# Patient Record
Sex: Female | Born: 1990 | Hispanic: No | Marital: Married | Smoking: Never smoker
Health system: Southern US, Community
[De-identification: ages and names within clinical notes are randomized; demographics above are authoritative.]

## PROBLEM LIST (undated history)

## (undated) ENCOUNTER — Inpatient Hospital Stay (HOSPITAL_COMMUNITY): Payer: Self-pay

## (undated) DIAGNOSIS — Z789 Other specified health status: Secondary | ICD-10-CM

---

## 2011-07-12 ENCOUNTER — Emergency Department (INDEPENDENT_AMBULATORY_CARE_PROVIDER_SITE_OTHER)
Admission: EM | Admit: 2011-07-12 | Discharge: 2011-07-12 | Disposition: A | Discharge status: Home / Self Care | Payer: Self-pay | Source: Home / Self Care | Attending: Emergency Medicine | Admitting: Emergency Medicine

## 2011-07-12 DIAGNOSIS — H1130 Conjunctival hemorrhage, unspecified eye: Secondary | ICD-10-CM

## 2011-07-12 DIAGNOSIS — T148XXA Other injury of unspecified body region, initial encounter: Secondary | ICD-10-CM

## 2011-07-12 MED ORDER — IBUPROFEN 800 MG PO TABS: 800.0000 mg | ORAL_TABLET | Freq: Three times a day (TID) | ORAL | Status: AC

## 2011-07-12 NOTE — ED Provider Notes (Signed)
Medical screening examination/treatment/procedure(s) were performed by non-physician practitioner and as supervising physician I was immediately available for consultation/collaboration.  Raynald Blend, MD 07/12/11 2120
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
## 2012-03-26 ENCOUNTER — Inpatient Hospital Stay (HOSPITAL_COMMUNITY): Payer: Medicaid Other

## 2012-03-26 ENCOUNTER — Encounter (HOSPITAL_COMMUNITY): Payer: Self-pay | Admitting: *Deleted

## 2012-03-26 ENCOUNTER — Inpatient Hospital Stay (HOSPITAL_COMMUNITY)
Admission: AD | Admit: 2012-03-26 | Discharge: 2012-03-27 | Disposition: A | Discharge status: Home / Self Care | Payer: Medicaid Other | Source: Ambulatory Visit | Attending: Obstetrics & Gynecology | Admitting: Obstetrics & Gynecology

## 2012-03-26 DIAGNOSIS — R1032 Left lower quadrant pain: Secondary | ICD-10-CM | POA: Insufficient documentation

## 2012-03-26 DIAGNOSIS — O209 Hemorrhage in early pregnancy, unspecified: Secondary | ICD-10-CM | POA: Insufficient documentation

## 2012-03-26 LAB — URINALYSIS, ROUTINE W REFLEX MICROSCOPIC
Bilirubin Urine: NEGATIVE
Glucose, UA: NEGATIVE mg/dL
Ketones, ur: NEGATIVE mg/dL
Specific Gravity, Urine: 1.01 (ref 1.005–1.030)
pH: 7 (ref 5.0–8.0)

## 2012-03-26 LAB — URINE MICROSCOPIC-ADD ON

## 2012-03-26 MED ORDER — ACETAMINOPHEN 500 MG PO TABS
1000.0000 mg | ORAL_TABLET | Freq: Once | ORAL | Status: AC
  Administered 2012-03-26: 1000 mg via ORAL
  Filled 2012-03-26: qty 2

## 2012-03-26 NOTE — MAU Note (Signed)
Pt G2 P1 at 8.2wks bleeding since 1400 today and cramping.

## 2012-03-27 DIAGNOSIS — O209 Hemorrhage in early pregnancy, unspecified: Secondary | ICD-10-CM

## 2012-03-27 LAB — HCG, QUANTITATIVE, PREGNANCY: hCG, Beta Chain, Quant, S: 9825 m[IU]/mL — ABNORMAL HIGH (ref <5)
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
## 2012-03-27 NOTE — ED Provider Notes (Signed)
I saw and examined patient and agree with above. By LMP pt is 8 wks 2 days. However, sono shows no yolk sack or fetal pole. No ectopic visualized. Quant hcg done today. Pt instructed to return to MAU in 2 days for f/u quant and exam. May still Truszkowski too early to rule out ectopic. Pt [facility] with [facility] for pregnancy care. Recommended tylenol for pain.    Sono:  IMPRESSION:  A gestational sac is seen within the uterus with mean sac diameter  corresponding to a gestational age of [redacted] weeks 2 days.  The gestational sac is slightly irregular and no definite fetal  pole or yolk sac is identified.  Follow-up sonography recommended in 10 days to establish fetal  viability.   Napoleon Form, MD 03/27/2012 2:09 AM

## 2012-03-28 ENCOUNTER — Encounter (HOSPITAL_COMMUNITY): Payer: Self-pay | Admitting: *Deleted

## 2012-03-28 ENCOUNTER — Other Ambulatory Visit (HOSPITAL_COMMUNITY): Payer: Medicaid Other

## 2012-03-28 ENCOUNTER — Inpatient Hospital Stay (HOSPITAL_COMMUNITY): Payer: Medicaid Other

## 2012-03-28 ENCOUNTER — Inpatient Hospital Stay (HOSPITAL_COMMUNITY)
Admission: AD | Admit: 2012-03-28 | Discharge: 2012-03-29 | Disposition: A | Discharge status: Home / Self Care | Payer: Medicaid Other | Source: Ambulatory Visit | Attending: Obstetrics & Gynecology | Admitting: Obstetrics & Gynecology

## 2012-03-28 DIAGNOSIS — O200 Threatened abortion: Secondary | ICD-10-CM | POA: Insufficient documentation

## 2012-03-28 DIAGNOSIS — O039 Complete or unspecified spontaneous abortion without complication: Secondary | ICD-10-CM

## 2012-03-28 LAB — CBC
Hemoglobin: 13.3 g/dL (ref 12.0–15.0)
MCH: 29.3 pg (ref 26.0–34.0)
MCHC: 34.8 g/dL (ref 30.0–36.0)
Platelets: 202 10*3/uL (ref 150–400)
RDW: 12.2 % (ref 11.5–15.5)

## 2012-03-28 LAB — ABO/RH: ABO/RH(D): B POS

## 2012-03-28 LAB — HCG, QUANTITATIVE, PREGNANCY: hCG, Beta Chain, Quant, S: 6812 m[IU]/mL — ABNORMAL HIGH (ref <5)

## 2012-03-28 NOTE — MAU Note (Signed)
Pt reports increased pain 2 hours, pt seen in MAU on Tuesday for bleeding with pregnancy, bleeding is worse today

## 2012-03-28 NOTE — MAU Provider Note (Signed)
History     CSN: [id number]  Arrival date and time: 03/28/12 2104   First Provider Initiated Contact with Patient 03/28/12 2214      Chief Complaint  Patient presents with  . Vaginal Bleeding   HPI This is a 21 y.o. female at 6 wks of gestation who presents with complaint of heavier bleeding. She was seen almost 2 days ago for bleeding and found to have a uterus with gestational sac only and no fetal pole or yolk sac.  GS was 2 weeks behind dates.   Now has heavier bleeding and cramping.   OB History    Grav Para Term Preterm Abortions TAB SAB Ect Mult Living   3 1 1       1       History reviewed. No pertinent past medical history.  Past Surgical History  Procedure Date  . Cesarean section   . No past surgeries     History reviewed. No pertinent family history.  History  Substance Use Topics  . Smoking status: Never Smoker   . Smokeless tobacco: Not on file  . Alcohol Use: No    Allergies: No Known Allergies  No prescriptions prior to admission    ROS As in HPI  Physical Exam   Blood pressure 113/80, pulse 98, temperature 99.1 F (37.3 C), temperature source Oral, resp. rate 18, height 4\' 11"  (1.499 m), weight 107 lb (48.535 kg), last menstrual period 01/28/2012, SpO2 100.00%.  Physical Exam  Constitutional: She is oriented to person, place, and time. She appears well-developed and well-nourished. No distress.  Cardiovascular: Normal rate.   Respiratory: Effort normal.  GI: Soft. She exhibits no distension and no mass. There is tenderness (lower pelvic). There is no rebound and no guarding.  Genitourinary: Uterus normal. Vaginal discharge (moderately heavy blood in vault with clots) found.       Cervix FT/long  Musculoskeletal: Normal range of motion.  Neurological: She is alert and oriented to person, place, and time.  Skin: Skin is warm and dry.  Psychiatric: She has a normal mood and affect.   Results for orders placed during the hospital encounter  of 03/28/12 (from the past 24 hour(s))  HCG, QUANTITATIVE, PREGNANCY     Status: Abnormal   Collection Time   03/28/12  9:25 PM      Component Value Range   hCG, Beta Chain, Quant, S 6812 (*) <5 mIU/mL  ABO/RH     Status: Normal   Collection Time   03/28/12  9:25 PM      Component Value Range   ABO/RH(D) B POS    CBC     Status: Normal   Collection Time   03/28/12  9:25 PM      Component Value Range   WBC 10.4  4.0 - 10.5 K/uL   RBC 4.54  3.87 - 5.11 MIL/uL   Hemoglobin 13.3  12.0 - 15.0 g/dL   HCT 40.9  81.1 - 91.4 %   MCV 84.1  78.0 - 100.0 fL   MCH 29.3  26.0 - 34.0 pg   MCHC 34.8  30.0 - 36.0 g/dL   RDW 78.2  95.6 - 21.3 %   Platelets 202  150 - 400 K/uL   Last Quant was 9000+ (less than 2 days ago) US Ob Transvaginal  03/29/2012  *RADIOLOGY REPORT*  Clinical Data: First trimester pregnancy with bleeding and cramping.  Beta HCG level 6812 (9825 on 03/27/2012).  TRANSVAGINAL OB ULTRASOUND  Technique:  Transvaginal  ultrasound was performed for evaluation of the gestation as well as the maternal uterus and adnexal regions.  Comparison: Prior examination 03/26/2012 2342 hours  Findings: A misshapen intrauterine gestational sac has a mean sac diameter of 9.1 mm (previously 14.2 mm).  Sonographer noted this to collapse and re-expand on real time imaging. There is no evidence of a fetal pole or yolk sac.  The endometrial complex is thickened and heterogeneous.  The maternal ovaries are visualized appear normal.  There is no adnexal mass or free pelvic fluid.  IMPRESSION: Findings are most consistent with an abortion in progress.   Original Report Authenticated By: Gerrianne Scale, M.D.     MAU Course  Procedures  Assessment and Plan  A:  Pregnancy at 6-8 weeks      Inevitable SAB with collapsed gestational sac in uterus      Stable hemoglobin  P:  Discussed results with her and her family        Brother interpreted per her request       Will proceed expectantly       Repeat Quant  Monday or Tuesday, expect a considerable drop       Rx Percocet for pain       Bleeding precautions.  Come back sooner if heavy bleeding  [facility] 03/28/2012, 10:46 PM

## 2012-03-29 DIAGNOSIS — O039 Complete or unspecified spontaneous abortion without complication: Secondary | ICD-10-CM

## 2012-03-29 MED ORDER — KETOROLAC TROMETHAMINE 30 MG/ML IJ SOLN
30.0000 mg | Freq: Once | INTRAMUSCULAR | Status: AC
  Administered 2012-03-29: 30 mg via INTRAMUSCULAR
  Filled 2012-03-29: qty 1

## 2012-03-29 MED ORDER — OXYCODONE-ACETAMINOPHEN 5-325 MG PO TABS: 1.0000 | ORAL_TABLET | ORAL | Status: AC | PRN

## 2012-03-29 MED ORDER — IBUPROFEN 600 MG PO TABS: 600.0000 mg | ORAL_TABLET | Freq: Four times a day (QID) | ORAL | Status: AC | PRN

## 2012-03-29 MED ORDER — OXYCODONE-ACETAMINOPHEN 5-325 MG PO TABS
1.0000 | ORAL_TABLET | Freq: Once | ORAL | Status: AC
  Administered 2012-03-29: 1 via ORAL
  Filled 2012-03-29: qty 1

## 2012-03-29 NOTE — Progress Notes (Signed)
Brother a the bedside with the pt . Brother is able to repeat information given about medication and returning to MAU if bleeding increases

## 2012-04-02 ENCOUNTER — Inpatient Hospital Stay (HOSPITAL_COMMUNITY)
Admission: AD | Admit: 2012-04-02 | Discharge: 2012-04-02 | Disposition: A | Discharge status: Home / Self Care | Payer: Medicaid Other | Source: Ambulatory Visit | Attending: Obstetrics & Gynecology | Admitting: Obstetrics & Gynecology

## 2012-04-02 ENCOUNTER — Encounter (HOSPITAL_COMMUNITY): Payer: Self-pay | Admitting: *Deleted

## 2012-04-02 DIAGNOSIS — O99891 Other specified diseases and conditions complicating pregnancy: Secondary | ICD-10-CM | POA: Insufficient documentation

## 2012-04-02 LAB — HCG, QUANTITATIVE, PREGNANCY: hCG, Beta Chain, Quant, S: 202 m[IU]/mL — ABNORMAL HIGH (ref <5)

## 2012-04-02 NOTE — MAU Note (Signed)
Pt in for follow up blood work

## 2012-04-07 NOTE — MAU Provider Note (Signed)
  History     CSN: [id number]  Arrival date and time: 03/26/12 2021   First Provider Initiated Contact with Patient 03/26/12 2241      Chief Complaint  Patient presents with  . Vaginal Bleeding   HPI  Pt is here on 9/10 for f/u HCG. She was seen on 03/27/12 with light bleeding with diffuse LLQ pain at [redacted]w[redacted]d pregnancy- ON [place] she had irregular sac measuring [redacted]w[redacted]d, no yolk sac HCG 9825.  On 03/28/2012 pt had increase in pain and bleeding.  Her HCG was 6812 and ultrasound showed ab in progress.  This date on 9/10, pt's HCG 202, pt had minimal complaints, minimal pain and bleeding.    History reviewed. No pertinent past medical history.  Past Surgical History  Procedure Date  . Cesarean section   . No past surgeries     History reviewed. No pertinent family history.  History  Substance Use Topics  . Smoking status: Never Smoker   . Smokeless tobacco: Not on file  . Alcohol Use: No    Allergies: No Known Allergies  No prescriptions prior to admission    ROS Physical Exam   Blood pressure 120/80, pulse 81, temperature 97.5 F (36.4 C), temperature source Oral, resp. rate 16, height 4\' 11"  (1.499 m), weight 49.805 kg (109 lb 12.8 oz), last menstrual period 01/28/2012.  Physical Exam  Nursing note and vitals reviewed. Constitutional: She is oriented to person, place, and time. She appears well-developed and well-nourished.  HENT:  Head: Normocephalic.  Eyes: Pupils are equal, round, and reactive to light.  Neck: Normal range of motion. Neck supple.  Cardiovascular: Normal rate.   Respiratory: Effort normal.  GI: Soft.  Musculoskeletal: Normal range of motion.  Neurological: She is alert and oriented to person, place, and time.  Skin: Skin is warm and dry.  Psychiatric: She has a normal mood and affect.    MAU Course  Procedures HCG 202 SAB- f/u in GYN clinic in one week for repeat HCG  Assessment and Plan  SAB F/u on 04/09/2012 in GYN clinic for repeat HCG- clinic to  call pt  Clair Bardwell 04/07/2012, 8:53 PM

## 2012-04-09 ENCOUNTER — Other Ambulatory Visit: Payer: Medicaid Other

## 2012-04-09 DIAGNOSIS — O039 Complete or unspecified spontaneous abortion without complication: Secondary | ICD-10-CM

## 2012-04-16 ENCOUNTER — Emergency Department (HOSPITAL_COMMUNITY)
Admission: EM | Admit: 2012-04-16 | Discharge: 2012-04-16 | Disposition: A | Discharge status: Home / Self Care | Payer: Medicaid Other | Source: Home / Self Care | Attending: Family Medicine | Admitting: Family Medicine

## 2012-04-16 ENCOUNTER — Encounter (HOSPITAL_COMMUNITY): Payer: Self-pay | Admitting: *Deleted

## 2012-04-16 DIAGNOSIS — H6120 Impacted cerumen, unspecified ear: Secondary | ICD-10-CM

## 2012-04-16 DIAGNOSIS — H61899 Other specified disorders of external ear, unspecified ear: Secondary | ICD-10-CM

## 2012-04-16 NOTE — ED Notes (Signed)
Pt reports bug in her left ear since last night

## 2012-04-16 NOTE — ED Notes (Signed)
Ear irrigation procedure delayed d/t this Clinical research associate (performer of procedure) being occupied w/ another pt's procedures & [facility] transfer.

## 2012-04-16 NOTE — ED Provider Notes (Signed)
History     CSN: [id number]  Arrival date & time 04/16/12  1106   First MD Initiated Contact with Patient 04/16/12 1301      Chief Complaint  Patient presents with  . Foreign Body in Ear    (Consider location/radiation/quality/duration/timing/severity/associated sxs/prior treatment) Patient is a 21 y.o. female presenting with foreign body in ear. The history is provided by the patient.  Foreign Body in Ear This is a new problem.  Patient complains of decreased hearing to left ear.  States ear feels like it is "plugged up" or as if something flew in her ear since last night.  Attempts to clear with warm water irrigation with no relief.  No history of chronic ear infections, hearing loss or ear tubes. Denies fever, upper respiratory symptoms and headache.  No drainage from ear and no neck pain.  History reviewed. No pertinent past medical history.  Past Surgical History  Procedure Date  . Cesarean section   . No past surgeries     Family History  Problem Relation Age of Onset  . Family history unknown: Yes    History  Substance Use Topics  . Smoking status: Never Smoker   . Smokeless tobacco: Not on file  . Alcohol Use: No    OB History    Grav Para Term Preterm Abortions TAB SAB Ect Mult Living   3 1 1       1       Review of Systems  Constitutional: Negative.   HENT: Positive for hearing loss and ear pain. Negative for congestion, sore throat, rhinorrhea, sneezing, postnasal drip, sinus pressure, tinnitus and ear discharge.   Respiratory: Negative.   Cardiovascular: Negative.     Allergies  Review of patient's allergies indicates no known allergies.  Home Medications  No current outpatient prescriptions on file.  BP 110/70  Pulse 78  Temp 97.5 F (36.4 C)  Resp 16  SpO2 98%  LMP 01/28/2012  Physical Exam  Nursing note and vitals reviewed. Constitutional: She is oriented to person, place, and time. Vital signs are normal. She appears well-developed  and well-nourished. She is active and cooperative.  HENT:  Head: Normocephalic.  Right Ear: Hearing, tympanic membrane, external ear and ear canal normal.  Left Ear: No drainage, swelling or tenderness. No foreign bodies. No mastoid tenderness. Tympanic membrane is not injected.  No middle ear effusion. Decreased hearing is noted.  Nose: Nose normal.  Mouth/Throat: Uvula is midline, oropharynx is clear and moist and mucous membranes are normal.       Left ear cerumen impaction.  Eyes: Conjunctivae normal are normal. Pupils are equal, round, and reactive to light. No scleral icterus.  Neck: Trachea normal. Neck supple.  Cardiovascular: Normal rate, regular rhythm, normal heart sounds, intact distal pulses and normal pulses.   Pulmonary/Chest: Effort normal and breath sounds normal.  Lymphadenopathy:       Head (right side): No submental, no submandibular, no tonsillar, no preauricular, no posterior auricular and no occipital adenopathy present.       Head (left side): No submental, no submandibular, no tonsillar, no preauricular, no posterior auricular and no occipital adenopathy present.    She has no cervical adenopathy.  Neurological: She is alert and oriented to person, place, and time. No cranial nerve deficit or sensory deficit.  Skin: Skin is warm and dry.  Psychiatric: She has a normal mood and affect. Her speech is normal and behavior is normal. Judgment and thought content normal. Cognition and memory  are normal.    ED Course  Procedures (including critical care time)  Labs Reviewed - No data to display No results found.   1. Foreign body sensation in ear canal   2. Hearing loss secondary to cerumen impaction       MDM          Johnsie Kindred, NP 04/16/12 1415

## 2012-04-18 NOTE — ED Provider Notes (Signed)
Medical screening examination/treatment/procedure(s) were performed by resident physician or non-physician practitioner and as supervising physician I was immediately available for consultation/collaboration.   KINDL,JAMES DOUGLAS MD.    James D Kindl, MD 04/18/12 2048 

## 2012-05-01 ENCOUNTER — Inpatient Hospital Stay (HOSPITAL_COMMUNITY)
Admission: AD | Admit: 2012-05-01 | Discharge: 2012-05-01 | Disposition: A | Discharge status: Home / Self Care | Payer: Medicaid Other | Source: Ambulatory Visit | Attending: Obstetrics & Gynecology | Admitting: Obstetrics & Gynecology

## 2012-05-01 ENCOUNTER — Encounter (HOSPITAL_COMMUNITY): Payer: Self-pay | Admitting: *Deleted

## 2012-05-01 DIAGNOSIS — R141 Gas pain: Secondary | ICD-10-CM

## 2012-05-01 DIAGNOSIS — R143 Flatulence: Secondary | ICD-10-CM | POA: Insufficient documentation

## 2012-05-01 DIAGNOSIS — R142 Eructation: Secondary | ICD-10-CM | POA: Insufficient documentation

## 2012-05-01 DIAGNOSIS — R140 Abdominal distension (gaseous): Secondary | ICD-10-CM

## 2012-05-01 HISTORY — DX: Other specified health status: Z78.9

## 2012-05-01 NOTE — MAU Provider Note (Signed)
  History     CSN: [id number]  Arrival date and time: 05/01/12 1746   First Provider Initiated Contact with Patient 05/01/12 1842      Chief Complaint  Patient presents with  . Uncertain of miscarriage    HPI This is a 21 y.o. female who is s/p Spontaneous Abortion on 03/29/12.  Her quantitative HCG levels were followed after that until they reached about 16.  Results for Teresa, [name] (MRN [id number]) as of 05/01/2012 18:30  Ref. Range 03/27/2012 00:45 03/28/2012 21:25 04/02/2012 13:46 04/09/2012 11:22  hCG, Beta Chain, Quant, S No range found 9825 (H) 6812 (H) 202 (H) 16.8   She had heavy bleeding and [place] confirmed SAB in progress.  Pt states she feels a "shaking" sensation in her upper abdomen and feels her abdomen is growing in size.  States it was never this big. Thinks she may still Teresa [name] pregnant and the baby is moving.   Denies diarrhea or constipation. Denies bleeding. Denies dysuria or frequency.  OB History    Grav Para Term Preterm Abortions TAB SAB Ect Mult Living   2 1 1  1  1   1       Past Medical History  Diagnosis Date  . No pertinent past medical history     Past Surgical History  Procedure Date  . Cesarean section     Family History  Problem Relation Age of Onset  . Other Neg Hx     History  Substance Use Topics  . Smoking status: Never Smoker   . Smokeless tobacco: Never Used  . Alcohol Use: No    Allergies: No Known Allergies  No prescriptions prior to admission    ROS See HPI  Physical Exam   Blood pressure 110/75, pulse 85, temperature 97.9 F (36.6 C), temperature source Oral, resp. rate 16, height 4\' 11"  (1.499 m), weight 107 lb 8 oz (48.762 kg), last menstrual period 01/28/2012, unknown if currently breastfeeding.  Physical Exam  Constitutional: She is oriented to person, place, and time. She appears well-developed and well-nourished. No distress.  Cardiovascular: Normal rate.   Respiratory: Effort normal.  GI: Soft. She exhibits distension  (very mild distention with small layer of subcutaneous fat palpable.). She exhibits no mass. There is no tenderness. There is no rebound and no guarding.  Musculoskeletal: Normal range of motion.  Neurological: She is alert and oriented to person, place, and time.  Skin: Skin is warm and dry.  Psychiatric: She has a normal mood and affect.   Declines pelvic exam  UPT Negative  MAU Course  Procedures  Assessment and Plan  A:  Abdominal bloating      Probable IBS      Not pregnant      Pseudocyesis  P:  Discussed lack of pregnancy       Discussed this is probably gas bloating, but pt wants eval of her digestive system      Agrees to referral to [facility]      Referral made  [facility] 05/01/2012, 6:53 PM

## 2012-05-01 NOTE — MAU Note (Signed)
Pt/family state pt was told she had miscarriage. Pt still feels pregnant, however took home upt that was negative. Pt states she never had a lot of bleeding. Denies pain at present.

## 2012-05-01 NOTE — MAU Note (Signed)
No period since miscarriage.bleeding stopped by mid Sept.

## 2012-05-01 NOTE — MAU Note (Signed)
Concerned about stomach becoming bigger. Notified preg test is negative.

## 2012-07-24 NOTE — L&D Delivery Note (Signed)
Delivery Note At 6:24 PM a viable and healthy female was delivered via VBAC, Spontaneous (Presentation: Left Occiput Anterior).  APGAR: 8, 9; weight pending.   Placenta status: Intact, Spontaneous.  Cord: 3 vessels with the following complications: None.   Anesthesia: Epidural  Episiotomy: None Lacerations: 1st degree perineal Suture Repair: 3.0 vicryl Est. Blood Loss (mL): 350  Mom to postpartum.  Baby to nursery-stable.  Tawni Carnes 02/19/2013, 6:53 PM

## 2012-10-30 ENCOUNTER — Other Ambulatory Visit (HOSPITAL_COMMUNITY): Payer: Self-pay | Admitting: Physician Assistant

## 2012-10-30 DIAGNOSIS — Z3689 Encounter for other specified antenatal screening: Secondary | ICD-10-CM

## 2012-10-30 LAB — OB RESULTS CONSOLE HEPATITIS B SURFACE ANTIGEN: Hepatitis B Surface Ag: NEGATIVE

## 2012-10-30 LAB — OB RESULTS CONSOLE GC/CHLAMYDIA
Chlamydia: NEGATIVE
Gonorrhea: NEGATIVE

## 2012-11-04 ENCOUNTER — Encounter (HOSPITAL_COMMUNITY): Payer: Self-pay

## 2012-11-04 ENCOUNTER — Ambulatory Visit (HOSPITAL_COMMUNITY)
Admission: RE | Admit: 2012-11-04 | Discharge: 2012-11-04 | Disposition: A | Discharge status: Home / Self Care | Payer: Medicaid Other | Source: Ambulatory Visit | Attending: Physician Assistant | Admitting: Physician Assistant

## 2012-11-04 DIAGNOSIS — Z3689 Encounter for other specified antenatal screening: Secondary | ICD-10-CM

## 2012-11-04 DIAGNOSIS — O358XX0 Maternal care for other (suspected) fetal abnormality and damage, not applicable or unspecified: Secondary | ICD-10-CM | POA: Insufficient documentation

## 2012-11-04 DIAGNOSIS — Z1389 Encounter for screening for other disorder: Secondary | ICD-10-CM | POA: Insufficient documentation

## 2012-11-04 DIAGNOSIS — Z363 Encounter for antenatal screening for malformations: Secondary | ICD-10-CM | POA: Insufficient documentation

## 2013-01-30 LAB — OB RESULTS CONSOLE GBS: GBS: NEGATIVE

## 2013-02-19 ENCOUNTER — Inpatient Hospital Stay (HOSPITAL_COMMUNITY)
Admission: AD | Admit: 2013-02-19 | Discharge: 2013-02-21 | DRG: 775 | Disposition: A | Discharge status: Home / Self Care | Payer: Medicaid Other | Source: Ambulatory Visit | Attending: Obstetrics and Gynecology | Admitting: Obstetrics and Gynecology

## 2013-02-19 ENCOUNTER — Encounter (HOSPITAL_COMMUNITY): Payer: Self-pay | Admitting: *Deleted

## 2013-02-19 ENCOUNTER — Inpatient Hospital Stay (HOSPITAL_COMMUNITY): Payer: Medicaid Other | Admitting: Anesthesiology

## 2013-02-19 ENCOUNTER — Encounter (HOSPITAL_COMMUNITY): Payer: Self-pay | Admitting: Anesthesiology

## 2013-02-19 DIAGNOSIS — O34219 Maternal care for unspecified type scar from previous cesarean delivery: Secondary | ICD-10-CM

## 2013-02-19 HISTORY — DX: Other specified health status: Z78.9

## 2013-02-19 LAB — RPR: RPR Ser Ql: NONREACTIVE

## 2013-02-19 LAB — CBC
Hemoglobin: 11.2 g/dL — ABNORMAL LOW (ref 12.0–15.0)
MCH: 28.5 pg (ref 26.0–34.0)
Platelets: 175 10*3/uL (ref 150–400)
RBC: 3.93 MIL/uL (ref 3.87–5.11)
WBC: 9.5 10*3/uL (ref 4.0–10.5)

## 2013-02-19 MED ORDER — LIDOCAINE HCL (PF) 1 % IJ SOLN
30.0000 mL | INTRAMUSCULAR | Status: DC | PRN
  Administered 2013-02-19: 30 mL via SUBCUTANEOUS
  Filled 2013-02-19 (×2): qty 30

## 2013-02-19 MED ORDER — IBUPROFEN 600 MG PO TABS
600.0000 mg | ORAL_TABLET | Freq: Four times a day (QID) | ORAL | Status: DC
  Administered 2013-02-19 – 2013-02-21 (×6): 600 mg via ORAL
  Filled 2013-02-19 (×6): qty 1

## 2013-02-19 MED ORDER — DIBUCAINE 1 % RE OINT: 1.0000 "application " | TOPICAL_OINTMENT | RECTAL | Status: DC | PRN

## 2013-02-19 MED ORDER — FENTANYL CITRATE 0.05 MG/ML IJ SOLN: 100.0000 ug | INTRAMUSCULAR | Status: DC | PRN

## 2013-02-19 MED ORDER — OXYTOCIN 40 UNITS IN LACTATED RINGERS INFUSION - SIMPLE MED
62.5000 mL/h | INTRAVENOUS | Status: DC
  Administered 2013-02-19: 999 mL/h via INTRAVENOUS
  Filled 2013-02-19: qty 1000

## 2013-02-19 MED ORDER — WITCH HAZEL-GLYCERIN EX PADS: 1.0000 "application " | MEDICATED_PAD | CUTANEOUS | Status: DC | PRN

## 2013-02-19 MED ORDER — LIDOCAINE HCL (PF) 1 % IJ SOLN
INTRAMUSCULAR | Status: DC | PRN
  Administered 2013-02-19: 3 mL
  Administered 2013-02-19: 4 mL

## 2013-02-19 MED ORDER — PHENYLEPHRINE 40 MCG/ML (10ML) SYRINGE FOR IV PUSH (FOR BLOOD PRESSURE SUPPORT)
80.0000 ug | PREFILLED_SYRINGE | INTRAVENOUS | Status: DC | PRN
  Filled 2013-02-19: qty 2

## 2013-02-19 MED ORDER — ACETAMINOPHEN 325 MG PO TABS: 650.0000 mg | ORAL_TABLET | ORAL | Status: DC | PRN

## 2013-02-19 MED ORDER — EPHEDRINE 5 MG/ML INJ
10.0000 mg | INTRAVENOUS | Status: DC | PRN
  Filled 2013-02-19: qty 2

## 2013-02-19 MED ORDER — ONDANSETRON HCL 4 MG PO TABS: 4.0000 mg | ORAL_TABLET | ORAL | Status: DC | PRN

## 2013-02-19 MED ORDER — OXYCODONE-ACETAMINOPHEN 5-325 MG PO TABS
1.0000 | ORAL_TABLET | ORAL | Status: DC | PRN
  Administered 2013-02-20: 1 via ORAL
  Filled 2013-02-19: qty 1

## 2013-02-19 MED ORDER — FENTANYL 2.5 MCG/ML BUPIVACAINE 1/10 % EPIDURAL INFUSION (WH - ANES)
14.0000 mL/h | INTRAMUSCULAR | Status: DC | PRN
  Filled 2013-02-19: qty 125

## 2013-02-19 MED ORDER — DIPHENHYDRAMINE HCL 50 MG/ML IJ SOLN: 12.5000 mg | INTRAMUSCULAR | Status: DC | PRN

## 2013-02-19 MED ORDER — LACTATED RINGERS IV SOLN: 500.0000 mL | Freq: Once | INTRAVENOUS | Status: DC

## 2013-02-19 MED ORDER — PRENATAL MULTIVITAMIN CH
1.0000 | ORAL_TABLET | Freq: Every day | ORAL | Status: DC
  Administered 2013-02-20: 1 via ORAL
  Filled 2013-02-19: qty 1

## 2013-02-19 MED ORDER — BENZOCAINE-MENTHOL 20-0.5 % EX AERO
1.0000 "application " | INHALATION_SPRAY | CUTANEOUS | Status: DC | PRN
  Administered 2013-02-19: 1 via TOPICAL
  Filled 2013-02-19: qty 56

## 2013-02-19 MED ORDER — ZOLPIDEM TARTRATE 5 MG PO TABS: 5.0000 mg | ORAL_TABLET | Freq: Every evening | ORAL | Status: DC | PRN

## 2013-02-19 MED ORDER — OXYTOCIN BOLUS FROM INFUSION: 500.0000 mL | INTRAVENOUS | Status: DC

## 2013-02-19 MED ORDER — LACTATED RINGERS IV SOLN: 500.0000 mL | INTRAVENOUS | Status: DC | PRN

## 2013-02-19 MED ORDER — CITRIC ACID-SODIUM CITRATE 334-500 MG/5ML PO SOLN: 30.0000 mL | ORAL | Status: DC | PRN

## 2013-02-19 MED ORDER — FENTANYL 2.5 MCG/ML BUPIVACAINE 1/10 % EPIDURAL INFUSION (WH - ANES)
INTRAMUSCULAR | Status: DC | PRN
  Administered 2013-02-19: 12 mL/h via EPIDURAL

## 2013-02-19 MED ORDER — LACTATED RINGERS IV SOLN
INTRAVENOUS | Status: DC
  Administered 2013-02-19 (×3): via INTRAVENOUS

## 2013-02-19 MED ORDER — FLEET ENEMA 7-19 GM/118ML RE ENEM: 1.0000 | ENEMA | RECTAL | Status: DC | PRN

## 2013-02-19 MED ORDER — ONDANSETRON HCL 4 MG/2ML IJ SOLN: 4.0000 mg | INTRAMUSCULAR | Status: DC | PRN

## 2013-02-19 MED ORDER — OXYCODONE-ACETAMINOPHEN 5-325 MG PO TABS: 1.0000 | ORAL_TABLET | ORAL | Status: DC | PRN

## 2013-02-19 MED ORDER — PHENYLEPHRINE 40 MCG/ML (10ML) SYRINGE FOR IV PUSH (FOR BLOOD PRESSURE SUPPORT)
80.0000 ug | PREFILLED_SYRINGE | INTRAVENOUS | Status: DC | PRN
  Filled 2013-02-19: qty 2
  Filled 2013-02-19: qty 5

## 2013-02-19 MED ORDER — ONDANSETRON HCL 4 MG/2ML IJ SOLN
4.0000 mg | Freq: Four times a day (QID) | INTRAMUSCULAR | Status: DC | PRN
  Administered 2013-02-19: 4 mg via INTRAVENOUS
  Filled 2013-02-19: qty 2

## 2013-02-19 MED ORDER — IBUPROFEN 600 MG PO TABS: 600.0000 mg | ORAL_TABLET | Freq: Four times a day (QID) | ORAL | Status: DC | PRN

## 2013-02-19 MED ORDER — DIPHENHYDRAMINE HCL 25 MG PO CAPS: 25.0000 mg | ORAL_CAPSULE | Freq: Four times a day (QID) | ORAL | Status: DC | PRN

## 2013-02-19 MED ORDER — SIMETHICONE 80 MG PO CHEW: 80.0000 mg | CHEWABLE_TABLET | ORAL | Status: DC | PRN

## 2013-02-19 MED ORDER — LANOLIN HYDROUS EX OINT: TOPICAL_OINTMENT | CUTANEOUS | Status: DC | PRN

## 2013-02-19 MED ORDER — TETANUS-DIPHTH-ACELL PERTUSSIS 5-2.5-18.5 LF-MCG/0.5 IM SUSP: 0.5000 mL | Freq: Once | INTRAMUSCULAR | Status: DC

## 2013-02-19 MED ORDER — SENNOSIDES-DOCUSATE SODIUM 8.6-50 MG PO TABS
2.0000 | ORAL_TABLET | Freq: Every day | ORAL | Status: DC
  Administered 2013-02-20: 2 via ORAL

## 2013-02-19 MED ORDER — EPHEDRINE 5 MG/ML INJ
10.0000 mg | INTRAVENOUS | Status: DC | PRN
  Filled 2013-02-19: qty 4
  Filled 2013-02-19: qty 2
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
## 2013-02-19 NOTE — MAU Note (Signed)
Pt speaks limnited Eng-has an intrep with her-states she has been feeling contractions since 0200

## 2013-02-19 NOTE — Anesthesia Preprocedure Evaluation (Signed)
Anesthesia Evaluation  Patient identified by MRN, date of birth, ID band Patient awake    Reviewed: Allergy & Precautions, H&P , Patient's Chart, lab work & pertinent test results  Airway Mallampati: II TM Distance: >3 FB Neck ROM: full    Dental no notable dental hx. (+) Teeth Intact   Pulmonary neg pulmonary ROS,  breath sounds clear to auscultation  Pulmonary exam normal       Cardiovascular negative cardio ROS  Rhythm:regular Rate:Normal     Neuro/Psych negative neurological ROS  negative psych ROS   GI/Hepatic negative GI ROS, Neg liver ROS,   Endo/Other  negative endocrine ROS  Renal/GU negative Renal ROS  negative genitourinary   Musculoskeletal   Abdominal Normal abdominal exam  (+)   Peds  Hematology negative hematology ROS (+)   Anesthesia Other Findings Speaks Burmese  Reproductive/Obstetrics (+) Pregnancy                           Anesthesia Physical Anesthesia Plan  ASA: II  Anesthesia Plan: Epidural   Post-op Pain Management:    Induction:   Airway Management Planned:   Additional Equipment:   Intra-op Plan:   Post-operative Plan:   Informed Consent: I have reviewed the patients History and Physical, chart, labs and discussed the procedure including the risks, benefits and alternatives for the proposed anesthesia with the patient or authorized representative who has indicated his/her understanding and acceptance.     Plan Discussed with: Anesthesiologist  Anesthesia Plan Comments:         Anesthesia Quick Evaluation

## 2013-02-19 NOTE — Progress Notes (Signed)
Camia Tome is a 22 y.o. G3P1011 at [redacted]w[redacted]d by ultrasound admitted for active labor  Subjective:good pain relief with epidural   Objective: BP 110/70  Pulse 99  Temp(Src) 97.7 F (36.5 C) (Axillary)  Resp 20  Ht 5' (1.524 m)  Wt 134 lb (60.782 kg)  BMI 26.17 kg/m2  SpO2 99%  LMP 01/28/2012      FHT:  FHR: 135 bpm, variability: moderate,  accelerations:  Present,  decelerations:  Absent UC:   regular, every 4  minutes SVE:   Dilation: 5.5 Effacement (%): 90 Station: -2 Exam by:: Dr. Debroah Loop  Labs: Lab Results  Component Value Date   WBC 9.5 02/19/2013   HGB 11.2* 02/19/2013   HCT 33.3* 02/19/2013   MCV 84.7 02/19/2013   PLT 175 02/19/2013    Assessment / Plan: Spontaneous labor, progressing normally  Labor: Progressing normally Preeclampsia:  no signs or symptoms of toxicity Fetal Wellbeing:  Category I Pain Control:  Epidural  Anticipated MOD:  NSVD  ARNOLD,JAMES 02/19/2013, 1:38 PM

## 2013-02-19 NOTE — Anesthesia Procedure Notes (Signed)
Epidural Patient location during procedure: OB Start time: 02/19/2013 9:03 AM  Staffing Anesthesiologist: Jakeline Dave A. Performed by: anesthesiologist   Preanesthetic Checklist Completed: patient identified, site marked, surgical consent, pre-op evaluation, timeout performed, IV checked, risks and benefits discussed and monitors and equipment checked  Epidural Patient position: sitting Prep: site prepped and draped and DuraPrep Patient monitoring: continuous pulse ox and blood pressure Approach: midline Injection technique: LOR air  Needle:  Needle type: Tuohy  Needle gauge: 17 G Needle length: 9 cm and 9 Needle insertion depth: 4 cm Catheter type: closed end flexible Catheter size: 19 Gauge Catheter at skin depth: 9 cm Test dose: negative and Other  Assessment Events: blood not aspirated, injection not painful, no injection resistance, negative IV test and no paresthesia  Additional Notes Patient identified. Risks and benefits discussed including failed block, incomplete  Pain control, post dural puncture headache, nerve damage, paralysis, blood pressure Changes, nausea, vomiting, reactions to medications-both toxic and allergic and post Partum back pain. All questions were answered. Patient expressed understanding and wished to proceed. Sterile technique was used throughout procedure. Epidural site was Dressed with sterile barrier dressing. No paresthesias, signs of intravascular injection Or signs of intrathecal spread were encountered. Burmese interpreter used for procedure. Patient was more comfortable after the epidural was dosed. Please see RN's note for documentation of vital signs and FHR which are stable.

## 2013-02-20 LAB — GC/CHLAMYDIA PROBE AMP
CT Probe RNA: NEGATIVE
GC Probe RNA: NEGATIVE

## 2013-02-20 NOTE — Anesthesia Postprocedure Evaluation (Signed)
Anesthesia Post Note  Patient: Teresa [name]  Procedure(s) Performed: * No procedures listed *  Anesthesia type: Epidural  Patient location: Mother/Baby  Post pain: Pain level controlled  Post assessment: Post-op Vital signs reviewed  Last Vitals:  Filed Vitals:   02/20/13 0545  BP: 96/63  Pulse: 86  Temp: 36.8 C  Resp: 16    Post vital signs: Reviewed  Level of consciousness: awake  Complications: No apparent anesthesia complications Anesthesia Post-op Note  Patient: Teresa [name]  Procedure(s) Performed: * No procedures listed *  Patient Location: PACU and Mother/Baby  Anesthesia Type:Epidural  Level of Consciousness: awake, alert , oriented and patient cooperative  Airway and Oxygen Therapy: Patient Spontanous Breathing  Post-op Pain: none  Post-op Assessment: Post-op Vital signs reviewed, Adequate PO intake, No headache, No backache, No residual numbness and No residual motor weakness  Post-op Vital Signs: Reviewed and stable  Complications: No apparent anesthesia complications

## 2013-02-20 NOTE — Progress Notes (Signed)
Post Partum Day 1 Subjective: no complaints, up ad lib, voiding and tolerating PO. No BM, pain 3/10 and well controlled.  Undecided on OCP.  Objective: Blood pressure 96/63, pulse 86, temperature 98.2 F (36.8 C), temperature source Oral, resp. rate 16, height 5' (1.524 m), weight 134 lb (60.782 kg), last menstrual period 01/28/2012, SpO2 99.00%, currently bottle feeding.  Physical Exam:  General: alert, cooperative and no distress Lochia: appropriate Uterine Fundus: firm Incision: no incision DVT Evaluation: No evidence of DVT seen on physical exam.   Recent Labs  02/19/13 0610  HGB 11.2*  HCT 33.3*    Assessment/Plan: Plan for discharge tomorrow Desires nexplanon Bottle feeding Pain controlled on motrin Meeting milestones   LOS: 1 day   Dorrie, Cocuzza 02/20/2013, 7:56 AM

## 2013-02-20 NOTE — Progress Notes (Signed)
I spoke with and examined patient and agree with PA-S's note and plan of care.  Gitel Beste Ryan Courteny Egler, MD Ob Fellow 02/20/2013 9:08 AM   

## 2013-02-20 NOTE — Progress Notes (Signed)
UR chart review completed.  

## 2013-02-21 MED ORDER — IBUPROFEN 200 MG PO TABS: 600.0000 mg | ORAL_TABLET | Freq: Four times a day (QID) | ORAL | Status: DC | PRN

## 2013-02-21 MED ORDER — PRENATAL MULTIVITAMIN CH: 1.0000 | ORAL_TABLET | Freq: Every day | ORAL | Status: DC

## 2013-02-21 NOTE — Discharge Summary (Signed)
Obstetric Discharge Summary Reason for Admission: onset of labor and TOLAC Prenatal Procedures: none Intrapartum Procedures: none Postpartum Procedures: none Complications-Operative and Postpartum: none Hemoglobin  Date Value Range Status  02/19/2013 11.2* 12.0 - 15.0 g/dL Final     HCT  Date Value Range Status  02/19/2013 33.3* 36.0 - 46.0 % Final    Physical Exam:  General: alert, cooperative, appears stated age and no distress Lochia: appropriate Uterine Fundus: firm U-1 Incision: no incision DVT Evaluation: No evidence of DVT seen on physical exam. Negative Homan's sign. No cords or calf tenderness. No significant calf/ankle edema.  Discharge Diagnoses: Vaginal Birth after C section  Discharge Information: Date: 02/21/2013 Activity: unrestricted, pelvic rest and no sex for 6 weeks Diet: routine Medications: PNV and Ibuprofen Condition: stable Instructions: refer to practice specific booklet Discharge to: home Follow-up Information   Follow up with [facility]. Schedule an appointment as soon as possible for a visit in 4 weeks. (please make appt for 4-6 wks)    Contact information:   [address] [place] [address] [phone]      Newborn Data: Live born female  Birth Weight: 7 lb 4.4 oz (3300 g) APGAR: 8, 9  Home with mother.  Tawana Scale 02/21/2013, 9:37 AM

## 2013-02-24 NOTE — H&P (Signed)
Attestation of Attending Supervision of Advanced Practitioner (CNM/NP): Evaluation and management procedures were performed by the Advanced Practitioner under my supervision and collaboration.  I have reviewed the Advanced Practitioner's note and chart, and I agree with the management and plan.  Keylin Ferryman 02/24/2013 2:22 PM   

## 2013-07-22 ENCOUNTER — Emergency Department (HOSPITAL_COMMUNITY)
Admission: EM | Admit: 2013-07-22 | Discharge: 2013-07-22 | Disposition: A | Discharge status: Home / Self Care | Payer: Medicaid Other | Attending: Emergency Medicine | Admitting: Emergency Medicine

## 2013-07-22 ENCOUNTER — Encounter (HOSPITAL_COMMUNITY): Payer: Self-pay | Admitting: Emergency Medicine

## 2013-07-22 DIAGNOSIS — J019 Acute sinusitis, unspecified: Secondary | ICD-10-CM | POA: Insufficient documentation

## 2013-07-22 DIAGNOSIS — H9209 Otalgia, unspecified ear: Secondary | ICD-10-CM | POA: Insufficient documentation

## 2013-07-22 LAB — RAPID STREP SCREEN (MED CTR MEBANE ONLY): Streptococcus, Group A Screen (Direct): NEGATIVE

## 2013-07-22 MED ORDER — AMOXICILLIN-POT CLAVULANATE 875-125 MG PO TABS: 1.0000 | ORAL_TABLET | Freq: Two times a day (BID) | ORAL | Status: DC

## 2013-07-22 NOTE — ED Provider Notes (Signed)
CSN: [id number]     Arrival date & time 07/22/13  1301 History   This chart was scribed for Raymon Mutton PA-C working with Flint Melter, MD by Ardelia Mems, ED Scribe. This patient was seen in room TR11C/TR11C and the patient's care was started at 4:27 PM   Chief Complaint  Patient presents with  . Otalgia  . Sore Throat    The history is provided by the patient. A language interpreter was used (Burmese).   HPI Comments: Teresa [name] is a 22 y.o. female who presents to the Emergency Department complaining a constant, gradually worsening sore throat over the past 2-3 weeks. She states that this pain is worsened with swallowing. She reports associated constant, moderate left ear pain onset 3 days ago. She describes her ear pain as a foreign body sensation, and states that she hears a "buzzing" sound in her left ear. She states that she has been using Q-tips. She also reports an associated sinus pressure, facial pain, nasal congestion, and occasional chills. She states that she has not tried any medications for her symptoms. She denies fever, cough, chest pain, SOB or difficulty breathing, neck pain or stiffness, nausea, emesis or any other symptoms.   Past Medical History  Diagnosis Date  . No pertinent past medical history   . Medical history non-contributory    Past Surgical History  Procedure Laterality Date  . Cesarean section     Family History  Problem Relation Age of Onset  . Other Neg Hx    History  Substance Use Topics  . Smoking status: Never Smoker   . Smokeless tobacco: Never Used  . Alcohol Use: No   OB History   Grav Para Term Preterm Abortions TAB SAB Ect Mult Living   3 2 2  1  1   2      Review of Systems  Constitutional: Positive for chills. Negative for fever.  HENT: Positive for congestion, ear pain (left), sinus pressure and sore throat.        Facial pain  Respiratory: Negative for cough and shortness of breath.   Cardiovascular: Negative for chest  pain.  Gastrointestinal: Negative for nausea and vomiting.  Musculoskeletal: Negative for neck pain and neck stiffness.    Allergies  Review of patient's allergies indicates no known allergies.  Home Medications   Current Outpatient Rx  Name  Route  Sig  Dispense  Refill  . amoxicillin-clavulanate (AUGMENTIN) 875-125 MG per tablet   Oral   Take 1 tablet by mouth 2 (two) times daily. One po bid x 7 days   14 tablet   0     Triage Vitals: BP 112/65  Pulse 88  Temp(Src) 97.7 F (36.5 C) (Oral)  Resp 18  Wt 104 lb 3 oz (47.259 kg)  SpO2 100%  Physical Exam  Nursing note and vitals reviewed. Constitutional: She is oriented to person, place, and time. She appears well-developed and well-nourished. No distress.  HENT:  Head: Normocephalic and atraumatic.  Right Ear: External ear normal.  Left Ear: External ear normal.  Mouth/Throat: Oropharynx is clear and moist. No oropharyngeal exudate.  Negative facial swelling. Discomfort upon palpation to the frontal and bilateral maxillary sinuses. Negative swelling, erythema, inflammation, lesions, sores, exudate, petechia noted to the posterior oropharynx at bilateral tonsils. Negative enlargement of the tonsils bilaterally. Uvula midline, symmetrical elevation. Negative uvula swelling identified.  Eyes: Conjunctivae and EOM are normal. Pupils are equal, round, and reactive to light. Right eye exhibits no discharge.  Left eye exhibits no discharge.  Neck: Normal range of motion. Neck supple. No tracheal deviation present.  Negative neck stiffness Negative nuchal rigidity Negative cervical lymphadenopathy Negative meningeal signs  Cardiovascular: Normal rate, regular rhythm and normal heart sounds.  Exam reveals no friction rub.   No murmur heard. Pulses:      Radial pulses are 2+ on the right side, and 2+ on the left side.  Cap refill less than 3 seconds  Pulmonary/Chest: Effort normal and breath sounds normal. No respiratory  distress. She has no wheezes. She has no rales. She exhibits no tenderness.  Airway intact Patient able to speak in full sentences without difficulty Negative use of accessory muscles  Musculoskeletal: Normal range of motion. She exhibits no tenderness.  Full ROM to upper and lower extremities without difficulty noted, negative ataxia noted   Lymphadenopathy:    She has no cervical adenopathy.  Neurological: She is alert and oriented to person, place, and time. She exhibits normal muscle tone. Coordination normal.  Skin: Skin is warm and dry. No rash noted. She is not diaphoretic. No erythema.  Psychiatric: She has a normal mood and affect. Her behavior is normal. Thought content normal.    ED Course  Procedures   DIAGNOSTIC STUDIES: Oxygen Saturation is 100% on RA, normal by my interpretation.    COORDINATION OF CARE: 4:32 PM- A Rapid Strep screen has bee obtained. Pt advised of plan for treatment and pt agrees.  Results for orders placed during the hospital encounter of 07/22/13  RAPID STREP SCREEN      Result Value Range   Streptococcus, Group A Screen (Direct) NEGATIVE  NEGATIVE    Labs Review Labs Reviewed  RAPID STREP SCREEN  CULTURE, GROUP A STREP   Imaging Review No results found.  EKG Interpretation   None       MDM   1. Acute sinusitis     Filed Vitals:   07/22/13 1309 07/22/13 1714  BP: 112/65 97/75  Pulse: 88 79  Temp: 97.7 F (36.5 C) 97.1 F (36.2 C)  TempSrc: Oral Oral  Resp: 18 18  Weight: 104 lb 3 oz (47.259 kg)   SpO2: 100% 100%   I personally performed the services described in this documentation, which was scribed in my presence. The recorded information has been reviewed and is accurate.  Patient presenting to emergency department with sore throat that has been ongoing for the past 2-3 weeks. Patient reports she's been experiencing left ear pain past 2-3 days. Alert and oriented. GCS seen. Heart rate and rhythm normal. Pulses:  Strong, radial 2+ bilaterally lungs clear to auscultation bilaterally. Negative neck stiffness, negative nuchal rigidity, negative cervical lymphadenopathy. Negative meningeal signs. Discomfort upon palpation to the frontal and maxillary sinuses. Ear exam unremarkable. Negative respiratory distress, patient is able to speak in full senses without difficulty. Rapid strep negative. Doubt streptococcal pharyngitis. Suspicion to Hoppel acute sinus infection. Patient stable, afebrile. Discharge patient with antibiotics. Referred patient to urgent care Center. Discussed with patient to rest and stay hydrated. Recommended Tylenol as needed. Discussed with patient to closely monitor symptoms and if symptoms are to worsen or change to report back to the ED - strict return instructions given.  Patient agreed to plan of care, understood, all questions answered.    Raymon Mutton, PA-C 07/23/13 1527

## 2013-07-22 NOTE — ED Notes (Signed)
Pt in c/o left earache and sore throat x2 days, denies fever

## 2013-07-22 NOTE — ED Notes (Addendum)
Pts brother interpreting for pt.  Pt speaks burmese.

## 2013-07-23 NOTE — ED Provider Notes (Signed)
Medical screening examination/treatment/procedure(s) were performed by non-physician practitioner and as supervising physician I was immediately available for consultation/collaboration.  Flint Melter, MD 07/23/13 [phone]

## 2013-07-24 LAB — CULTURE, GROUP A STREP

## 2014-03-17 ENCOUNTER — Emergency Department (HOSPITAL_COMMUNITY)
Admission: EM | Admit: 2014-03-17 | Discharge: 2014-03-17 | Disposition: A | Discharge status: Home / Self Care | Payer: Self-pay | Attending: Emergency Medicine | Admitting: Emergency Medicine

## 2014-03-17 ENCOUNTER — Emergency Department (HOSPITAL_COMMUNITY): Payer: Medicaid Other

## 2014-03-17 ENCOUNTER — Encounter (HOSPITAL_COMMUNITY): Payer: Self-pay | Admitting: Emergency Medicine

## 2014-03-17 ENCOUNTER — Emergency Department (HOSPITAL_COMMUNITY): Payer: Self-pay

## 2014-03-17 DIAGNOSIS — Z3202 Encounter for pregnancy test, result negative: Secondary | ICD-10-CM | POA: Insufficient documentation

## 2014-03-17 DIAGNOSIS — Z9889 Other specified postprocedural states: Secondary | ICD-10-CM | POA: Insufficient documentation

## 2014-03-17 DIAGNOSIS — R1030 Lower abdominal pain, unspecified: Secondary | ICD-10-CM

## 2014-03-17 DIAGNOSIS — R109 Unspecified abdominal pain: Secondary | ICD-10-CM

## 2014-03-17 DIAGNOSIS — R112 Nausea with vomiting, unspecified: Secondary | ICD-10-CM

## 2014-03-17 DIAGNOSIS — R1033 Periumbilical pain: Secondary | ICD-10-CM | POA: Insufficient documentation

## 2014-03-17 DIAGNOSIS — N898 Other specified noninflammatory disorders of vagina: Secondary | ICD-10-CM | POA: Insufficient documentation

## 2014-03-17 LAB — CBC WITH DIFFERENTIAL/PLATELET
Basophils Absolute: 0 10*3/uL (ref 0.0–0.1)
Basophils Relative: 0 % (ref 0–1)
EOS ABS: 0.2 10*3/uL (ref 0.0–0.7)
Eosinophils Relative: 4 % (ref 0–5)
HCT: 42.6 % (ref 36.0–46.0)
Hemoglobin: 13.7 g/dL (ref 12.0–15.0)
LYMPHS ABS: 1.7 10*3/uL (ref 0.7–4.0)
Lymphocytes Relative: 35 % (ref 12–46)
MCH: 25.9 pg — AB (ref 26.0–34.0)
MCHC: 32.2 g/dL (ref 30.0–36.0)
MCV: 80.7 fL (ref 78.0–100.0)
Monocytes Absolute: 0.2 10*3/uL (ref 0.1–1.0)
Monocytes Relative: 5 % (ref 3–12)
NEUTROS PCT: 56 % (ref 43–77)
Neutro Abs: 2.6 10*3/uL (ref 1.7–7.7)
PLATELETS: 225 10*3/uL (ref 150–400)
RBC: 5.28 MIL/uL — AB (ref 3.87–5.11)
RDW: 13.8 % (ref 11.5–15.5)
WBC: 4.7 10*3/uL (ref 4.0–10.5)

## 2014-03-17 LAB — COMPREHENSIVE METABOLIC PANEL
ALBUMIN: 4.2 g/dL (ref 3.5–5.2)
ALK PHOS: 54 U/L (ref 39–117)
ALT: 11 U/L (ref 0–35)
AST: 17 U/L (ref 0–37)
Anion gap: 12 (ref 5–15)
BUN: 9 mg/dL (ref 6–23)
CO2: 23 mEq/L (ref 19–32)
Calcium: 9 mg/dL (ref 8.4–10.5)
Chloride: 104 mEq/L (ref 96–112)
Creatinine, Ser: 0.58 mg/dL (ref 0.50–1.10)
GFR calc non Af Amer: >90 mL/min (ref >90)
GLUCOSE: 86 mg/dL (ref 70–99)
POTASSIUM: 3.9 meq/L (ref 3.7–5.3)
SODIUM: 139 meq/L (ref 137–147)
TOTAL PROTEIN: 7.9 g/dL (ref 6.0–8.3)
Total Bilirubin: 0.5 mg/dL (ref 0.3–1.2)

## 2014-03-17 LAB — URINALYSIS, ROUTINE W REFLEX MICROSCOPIC
BILIRUBIN URINE: NEGATIVE
Glucose, UA: NEGATIVE mg/dL
KETONES UR: 15 mg/dL — AB
NITRITE: NEGATIVE
PROTEIN: NEGATIVE mg/dL
Specific Gravity, Urine: 1.014 (ref 1.005–1.030)
UROBILINOGEN UA: 0.2 mg/dL (ref 0.0–1.0)
pH: 6 (ref 5.0–8.0)

## 2014-03-17 LAB — URINE MICROSCOPIC-ADD ON

## 2014-03-17 LAB — WET PREP, GENITAL
CLUE CELLS WET PREP: NONE SEEN
TRICH WET PREP: NONE SEEN
YEAST WET PREP: NONE SEEN

## 2014-03-17 LAB — PREGNANCY, URINE: Preg Test, Ur: NEGATIVE

## 2014-03-17 LAB — LIPASE, BLOOD: Lipase: 43 U/L (ref 11–59)

## 2014-03-17 MED ORDER — ONDANSETRON 4 MG PO TBDP
4.0000 mg | ORAL_TABLET | Freq: Once | ORAL | Status: AC
  Administered 2014-03-17: 4 mg via ORAL
  Filled 2014-03-17: qty 1

## 2014-03-17 MED ORDER — ONDANSETRON HCL 8 MG PO TABS: 8.0000 mg | ORAL_TABLET | Freq: Three times a day (TID) | ORAL | Status: DC | PRN

## 2014-03-17 MED ORDER — ONDANSETRON HCL 4 MG/2ML IJ SOLN
4.0000 mg | Freq: Once | INTRAMUSCULAR | Status: AC
  Administered 2014-03-17: 4 mg via INTRAVENOUS
  Filled 2014-03-17: qty 2

## 2014-03-17 MED ORDER — SODIUM CHLORIDE 0.9 % IV BOLUS (SEPSIS)
1000.0000 mL | Freq: Once | INTRAVENOUS | Status: AC
  Administered 2014-03-17: 1000 mL via INTRAVENOUS

## 2014-03-17 MED ORDER — HYDROCODONE-ACETAMINOPHEN 5-325 MG PO TABS: 1.0000 | ORAL_TABLET | Freq: Four times a day (QID) | ORAL | Status: DC | PRN

## 2014-03-17 MED ORDER — IOHEXOL 300 MG/ML  SOLN
25.0000 mL | INTRAMUSCULAR | Status: AC
  Administered 2014-03-17 (×2): 25 mL via ORAL

## 2014-03-17 MED ORDER — MORPHINE SULFATE 4 MG/ML IJ SOLN
4.0000 mg | Freq: Once | INTRAMUSCULAR | Status: AC
  Administered 2014-03-17: 4 mg via INTRAVENOUS
  Filled 2014-03-17: qty 1

## 2014-03-17 MED ORDER — IOHEXOL 300 MG/ML  SOLN
80.0000 mL | Freq: Once | INTRAMUSCULAR | Status: AC | PRN
  Administered 2014-03-17: 80 mL via INTRAVENOUS

## 2014-03-17 MED ORDER — CEFTRIAXONE SODIUM 1 G IJ SOLR
1.0000 g | Freq: Once | INTRAMUSCULAR | Status: AC
Start: 2014-03-17 — End: 2014-03-17
  Administered 2014-03-17: 1 g via INTRAMUSCULAR
  Filled 2014-03-17: qty 10

## 2014-03-17 MED ORDER — AZITHROMYCIN 250 MG PO TABS
1000.0000 mg | ORAL_TABLET | Freq: Once | ORAL | Status: AC
  Administered 2014-03-17: 1000 mg via ORAL
  Filled 2014-03-17: qty 4

## 2014-03-17 NOTE — Consult Note (Signed)
[facility] Consult Note  Audrina Gullett 14-Dec-1990  [id number].    Requesting MD: Dr. Stark Jock Chief Complaint/Reason for Consult: abdominal pain ?appendicitis on CT scan  HPI:  History/physical obtained with the use of a burmese interpreter on pacific interpreters line.  23 y/o healthy without significant PMH G2P2A0 Burmese (minimal [place]) female presents to [facility] with 1-2 weeks of abdominal pain, N/V.  She does not know what brought it on and has never had this pain before.  She says the pain has gradually worsened over the last few days.  She denies any vomiting today, no fevers/chills, no CP/SOB, no trouble urinating/deficating.  She does note that she had an implenon placed in her am and since then she has had bleeding nearly every day since placed 1 year ago.  She has a 23 y/o and 23 y/o children at home.  She had a c-section with her first pregnancy.    CT was concerned for a slightly prominent appendix.  She has multiple b/l ovarian cysts concerning for polycystic ovaries.  She has a prominent mesenteric lymph node.  CT recommended pelvic US.  WBC is normal as well as rest of labs.  She is mildly tachycardic at 104, normotensive, normal temp.    ROS: All systems reviewed and otherwise negative except for as above  Family History  Problem Relation Age of Onset  . Other Neg Hx     Past Medical History  Diagnosis Date  . No pertinent past medical history   . Medical history non-contributory     Past Surgical History  Procedure Laterality Date  . Cesarean section      Social History:  reports that she has never smoked. She has never used smokeless tobacco. She reports that she does not drink alcohol or use illicit drugs.  Allergies: No Known Allergies   (Not in a hospital admission)  Blood pressure 99/68, pulse 104, temperature 97.6 F (36.4 C), temperature source Oral, resp. rate 17, height 5' (1.524 m), weight 110 lb (49.896 kg), SpO2 100.00%, unknown if currently  breastfeeding. Physical Exam: General: pleasant, WD/WN Asian female who is laying in bed in mild distress due to pain HEENT: head is normocephalic, atraumatic.  Sclera are noninjected.  PERRL.  Ears and nose without any masses or lesions.  Mouth is pink and moist Heart: regular, rate, and rhythm.  No obvious murmurs, gallops, or rubs noted.  Palpable pedal pulses bilaterally Lungs: CTAB, no wheezes, rhonchi, or rales noted.  Respiratory effort nonlabored Abd: soft, tender in the periumbilical and left mid abdomen, no tenderness at Mcburney's point, no peritoneal signs, no rebound/guarding, +BS, no masses, hernias, or organomegaly, low horizontal scar well healed from c-section MS: all 4 extremities are symmetrical with no cyanosis, clubbing, or edema. Skin: mild diaphoresis, warm with no masses, lesions, or rashes Psych: A&Ox3 with an appropriate affect.   Results for orders placed during the hospital encounter of 03/17/14 (from the past 48 hour(s))  CBC WITH DIFFERENTIAL     Status: Abnormal   Collection Time    03/17/14 10:53 AM      Result Value Ref Range   WBC 4.7  4.0 - 10.5 K/uL   RBC 5.28 (*) 3.87 - 5.11 MIL/uL   Hemoglobin 13.7  12.0 - 15.0 g/dL   HCT 42.6  36.0 - 46.0 %   MCV 80.7  78.0 - 100.0 fL   MCH 25.9 (*) 26.0 - 34.0 pg   MCHC 32.2  30.0 - 36.0 g/dL  RDW 13.8  11.5 - 15.5 %   Platelets 225  150 - 400 K/uL   Neutrophils Relative % 56  43 - 77 %   Neutro Abs 2.6  1.7 - 7.7 K/uL   Lymphocytes Relative 35  12 - 46 %   Lymphs Abs 1.7  0.7 - 4.0 K/uL   Monocytes Relative 5  3 - 12 %   Monocytes Absolute 0.2  0.1 - 1.0 K/uL   Eosinophils Relative 4  0 - 5 %   Eosinophils Absolute 0.2  0.0 - 0.7 K/uL   Basophils Relative 0  0 - 1 %   Basophils Absolute 0.0  0.0 - 0.1 K/uL  COMPREHENSIVE METABOLIC PANEL     Status: None   Collection Time    03/17/14 10:53 AM      Result Value Ref Range   Sodium 139  137 - 147 mEq/L   Potassium 3.9  3.7 - 5.3 mEq/L   Chloride 104  96  - 112 mEq/L   CO2 23  19 - 32 mEq/L   Glucose, Bld 86  70 - 99 mg/dL   BUN 9  6 - 23 mg/dL   Creatinine, Ser 0.58  0.50 - 1.10 mg/dL   Calcium 9.0  8.4 - 10.5 mg/dL   Total Protein 7.9  6.0 - 8.3 g/dL   Albumin 4.2  3.5 - 5.2 g/dL   AST 17  0 - 37 U/L   ALT 11  0 - 35 U/L   Alkaline Phosphatase 54  39 - 117 U/L   Total Bilirubin 0.5  0.3 - 1.2 mg/dL   GFR calc non Af Amer >90  >90 mL/min   GFR calc Af Amer >90  >90 mL/min   Comment: (NOTE)     The eGFR has been calculated using the CKD EPI equation.     This calculation has not been validated in all clinical situations.     eGFR's persistently <90 mL/min signify possible Chronic Kidney     Disease.   Anion gap 12  5 - 15  LIPASE, BLOOD     Status: None   Collection Time    03/17/14 10:53 AM      Result Value Ref Range   Lipase 43  11 - 59 U/L  URINALYSIS, ROUTINE W REFLEX MICROSCOPIC     Status: Abnormal   Collection Time    03/17/14 11:04 AM      Result Value Ref Range   Color, Urine AMBER (*) YELLOW   Comment: BIOCHEMICALS MAY Ocain AFFECTED BY COLOR   APPearance CLOUDY (*) CLEAR   Specific Gravity, Urine 1.014  1.005 - 1.030   pH 6.0  5.0 - 8.0   Glucose, UA NEGATIVE  NEGATIVE mg/dL   Hgb urine dipstick LARGE (*) NEGATIVE   Bilirubin Urine NEGATIVE  NEGATIVE   Ketones, ur 15 (*) NEGATIVE mg/dL   Protein, ur NEGATIVE  NEGATIVE mg/dL   Urobilinogen, UA 0.2  0.0 - 1.0 mg/dL   Nitrite NEGATIVE  NEGATIVE   Leukocytes, UA TRACE (*) NEGATIVE  PREGNANCY, URINE     Status: None   Collection Time    03/17/14 11:04 AM      Result Value Ref Range   Preg Test, Ur NEGATIVE  NEGATIVE   Comment:            THE SENSITIVITY OF THIS     METHODOLOGY IS >20 mIU/mL.  URINE MICROSCOPIC-ADD ON     Status: Abnormal  Collection Time    03/17/14 11:04 AM      Result Value Ref Range   Squamous Epithelial / LPF MANY (*) RARE   WBC, UA 7-10  <3 WBC/hpf   RBC / HPF 21-50  <3 RBC/hpf   Bacteria, UA FEW (*) RARE   Ct Abdomen Pelvis W  Contrast  03/17/2014   CLINICAL DATA:  Pain.  EXAM: CT ABDOMEN AND PELVIS WITH CONTRAST  TECHNIQUE: Multidetector CT imaging of the abdomen and pelvis was performed using the standard protocol following bolus administration of intravenous contrast.  CONTRAST:  89m OMNIPAQUE IOHEXOL 300 MG/ML  SOLN  COMPARISON:  None.  FINDINGS: Liver normal. Spleen normal. Pancreas normal. No biliary distention. Gallbladder is not distended.  Adrenals normal. No focal renal abnormality. No hydronephrosis or obstructing ureteral stone. Bladder is nondistended. Bilateral ovarian enlargement noted. Multiple tiny ovarian cysts appear to Zunker present. Polycystic ovary disease could present in this fashion. Tiny low-density noted in the left body of the uterus is most like versus tiny fibroid. Further evaluation of these findings can [place] performed with pelvic ultrasound. Also correlate with previously ordered pregnancy test. No free pelvic fluid.  No significant adenopathy. Aorta normal caliber. Visceral vessels are patent. Portal vein is patent.  Minimal prominence of the appendix to 9 mm noted. Appendicitis cannot Ashline completely excluded. Tiny amount of oral contrast versus appendicolith noted in the orifice of the appendix. Stool noted throughout the colon. No evidence of bowel obstruction. No free air. Multiple mesenteric lymph nodes are noted. Mesenteric adenitis cannot Golightly excluded. No significant hernia.  Lung bases are clear. Heart size normal. No acute bony abnormality.  IMPRESSION: 1. Very mild prominence of the appendix noted. Subtle changes of appendicitis cannot Lias excluded. Associated small mesenteric lymph nodes suggesting mesenteric adenitis noted. 2. Bilateral enlarged ovaries with probable multiple tiny cysts. This suggest polycystic ovarian disease. Subtle low-density noted in the left uterine cornua. This may represent a fibroid. Pelvic ultrasound is suggested for further evaluation of these findings.   Electronically  Signed   By: TMarcello Moores Register   On: 03/17/2014 15:14      Assessment/Plan Mild prominence of appendix Mesenteric LAD Abdominal pain (periumbilical and mid left abdomen) Nausea/vomiting Multiple ovarian cysts Metrorrhagia - Breakthrough bleeding on contraceptive rod H/o c-section, G2P2A0  Plan: 1.  Need GYN workup - pelvic exam, pelvic [place] ?GYN consult.  Does not appear to have pain in RLQ at all.  We reviewed the CT and it is not overwhelming for appendicitis.  WBC normal.  History does not go alone with appendicitis.  She has multiple b/l ovarian cysts which may Chanda the cause of her pain.   2.  Does not have a surgical abdomen, no need for appendectomy at this time.  If the pain worsens she may need to return to the ER for re-evaluation. 3.  Rest of workup per EDP   DCoralie Keens PBever[facility] LLCSurgery 03/17/2014, 4:09 PM Pager: [phone]

## 2014-03-17 NOTE — ED Notes (Signed)
Gave pt prescription for zofran. Pt wishes to rest here a little longer. Will continue to monitor.

## 2014-03-17 NOTE — ED Notes (Signed)
Patient states has been having abdominal pain throughout for 2 weeks.  Patient denies N/V.   Patient denies urinary problems.

## 2014-03-17 NOTE — ED Provider Notes (Signed)
18:15- I. was asked by ED nursing to evaluate the patient, prior to discharging her. The nurse is concerned that she has vomited and still feels ill.  The patient was seen earlier, and discharged by Dr.Delo. She was diagnosed with lower abdominal pain. She has had a comprehensive evaluation and treatment. She has received IV fluids, IV antibiotics, IV antiemetics, and IV analgesia, with morphine. She was also treated with oral Zithromax, 1 g. The emesis occurred after that.  Exam- alert, calm, cooperative. She complains of "dizziness". Heart regular rate and rhythm without murmur. Lungs clear to auscultation. Abdomen normal bowel sounds, soft, mild left mid abdominal tenderness. No rebound tenderness.  Additional Zofran, 4 mg given. The patient stated that she wanted to try going home with oral antibiotic, and oral analgesia. A family member is with her and agrees to this plan.      Flint Melter, MD 03/18/14 [phone]

## 2014-03-17 NOTE — ED Notes (Signed)
Pt no longer vomiting. Pt ready for D/C. MD aware. Will wheel pt out to car

## 2014-03-17 NOTE — ED Notes (Signed)
Pt vomited just prior to d/c. Notified PA. Will give PO zofran and monitor.

## 2014-03-17 NOTE — Discharge Instructions (Signed)
Hydrocodone as prescribed as needed for pain.  Followup with your GYN to discuss removal of your Implanon.  Return to the emergency department if you develop severe pain, high fever, or any other new or concerning symptoms.   Abdominal Pain Many things can cause abdominal pain. Usually, abdominal pain is not caused by a disease and will improve without treatment. It can often Roach observed and treated at home. Your health care provider will do a physical exam and possibly order blood tests and X-rays to help determine the seriousness of your pain. However, in many cases, more time must pass before a clear cause of the pain can Beckett found. Before that point, your health care provider may not know if you need more testing or further treatment. HOME CARE INSTRUCTIONS  Monitor your abdominal pain for any changes. The following actions may help to alleviate any discomfort you are experiencing:  Only take over-the-counter or prescription medicines as directed by your health care provider.  Do not take laxatives unless directed to do so by your health care provider.  Try a clear liquid diet (broth, tea, or water) as directed by your health care provider. Slowly move to a bland diet as tolerated. SEEK MEDICAL CARE IF:  You have unexplained abdominal pain.  You have abdominal pain associated with nausea or diarrhea.  You have pain when you urinate or have a bowel movement.  You experience abdominal pain that wakes you in the night.  You have abdominal pain that is worsened or improved by eating food.  You have abdominal pain that is worsened with eating fatty foods.  You have a fever. SEEK IMMEDIATE MEDICAL CARE IF:   Your pain does not go away within 2 hours.  You keep throwing up (vomiting).  Your pain is felt only in portions of the abdomen, such as the right side or the left lower portion of the abdomen.  You pass bloody or black tarry stools. MAKE SURE YOU:  Understand these  instructions.   Will watch your condition.   Will get help right away if you are not doing well or get worse.  Document Released: 04/19/2005 Document Revised: 07/15/2013 Document Reviewed: 03/19/2013 [facility] Patient Information 2015 [place], [place]. This information is not intended to replace advice given to you by your health care provider. Make sure you discuss any questions you have with your health care provider.

## 2014-03-17 NOTE — ED Notes (Signed)
Pt still vomiting, but pt states she wishes to go home. PA aware.

## 2014-03-17 NOTE — ED Notes (Signed)
Pt vomiting when returned from CT. Notified MD. Will give zofran

## 2014-03-17 NOTE — ED Notes (Signed)
C/o lower back and abd. Pain onset 1-2 weeks ago. Rates pain 8-9  Denies urinary sx.

## 2014-03-17 NOTE — ED Notes (Signed)
Gave pt crackers. 

## 2014-03-17 NOTE — ED Provider Notes (Signed)
CSN: [id number]     Arrival date & time 03/17/14  0909 History   First MD Initiated Contact with Patient 03/17/14 [phone]     Chief Complaint  Patient presents with  . Abdominal Pain     (Consider location/radiation/quality/duration/timing/severity/associated sxs/prior Treatment) HPI Comments: Patient is a 23 year old female otherwise healthy who presents for evaluation of periumbilical abdominal pain. This is been ongoing for the past 2 weeks and worsening. She denies any nausea, vomiting, or diarrhea. She denies any constipation. She's had no urinary complaints. Her last period was one year ago as she has the Implanon device.  Patient is a 23 y.o. female presenting with abdominal pain. The history is provided by the patient.  Abdominal Pain Pain location:  Periumbilical Pain quality: gnawing   Pain radiates to:  Does not radiate Pain severity:  Moderate Onset quality:  Gradual Duration:  2 weeks Timing:  Constant Progression:  Worsening Chronicity:  New Relieved by:  Nothing Worsened by:  Nothing tried Ineffective treatments:  None tried Associated symptoms: no anorexia, no chills, no constipation, no cough, no dysuria, no fever, no hematuria, no nausea, no vaginal bleeding and no vaginal discharge     Past Medical History  Diagnosis Date  . No pertinent past medical history   . Medical history non-contributory    Past Surgical History  Procedure Laterality Date  . Cesarean section     Family History  Problem Relation Age of Onset  . Other Neg Hx    History  Substance Use Topics  . Smoking status: Never Smoker   . Smokeless tobacco: Never Used  . Alcohol Use: No   OB History   Grav Para Term Preterm Abortions TAB SAB Ect Mult Living   Review of Systems  Constitutional: Negative for fever and chills.  Respiratory: Negative for cough.   Gastrointestinal: Positive for abdominal pain. Negative for nausea, constipation and anorexia.   Genitourinary: Negative for dysuria, hematuria, vaginal bleeding and vaginal discharge.  All other systems reviewed and are negative.     Allergies  Review of patient's allergies indicates no known allergies.  Home Medications   Prior to Admission medications   Medication Sig Start Date End Date Taking? Authorizing Provider  acetaminophen (TYLENOL) 325 MG tablet Take 650 mg by mouth every 6 (six) hours as needed (pain).   Yes Historical Provider, MD   BP 109/72  Pulse 90  Temp(Src) 98.8 F (37.1 C) (Oral)  Resp 19  Ht 5' (1.524 m)  Wt 110 lb (49.896 kg)  BMI 21.48 kg/m2  SpO2 100% Physical Exam  Nursing note and vitals reviewed. Constitutional: She is oriented to person, place, and time. She appears well-developed and well-nourished. No distress.  HENT:  Head: Normocephalic and atraumatic.  Neck: Normal range of motion. Neck supple.  Cardiovascular: Normal rate and regular rhythm.  Exam reveals no gallop and no friction rub.   No murmur heard. Pulmonary/Chest: Effort normal and breath sounds normal. No respiratory distress. She has no wheezes.  Abdominal: Soft. Bowel sounds are normal. She exhibits no distension. There is tenderness. There is no rebound and no guarding.  There is mild tenderness to palpation in the periumbilical region and lower abdomen. There is also mild tenderness in the epigastrium. There is no rebound and no guarding.  Genitourinary: Vaginal discharge found.  The external genitalia is normal in appearance.  There is dark blood in the vaginal vault, however  no active bleeding.  She does have slight discharge present but no cmt or adnexal masses.   Musculoskeletal: Normal range of motion.  Neurological: She is alert and oriented to person, place, and time.  Skin: Skin is warm and dry. She is not diaphoretic.    ED Course  Procedures (including critical care time) Labs Review Labs Reviewed - No data to display  Imaging Review No results found.    EKG Interpretation None      MDM   Final diagnoses:  None    Patient is a 23 year old female who presents with lower abdominal pain for the past 2 weeks. Workup reveals no elevation of white count, however ct scan of the abdomen and pelvis does reveal a mildly prominent appendix. I've spoken with general surgery who has evaluated the patient and do not feel as though she has appendicitis. She will undergo an ultrasound to evaluate what appear to Daniello tiny ovarian cysts bilaterally.  Pelvic exam reveals some dark blood within the vaginal vault, however no active bleeding.  There is a slight, yellow discharge, but no cmt or adnexal masses.  Ultrasound is essentially unremarkable.    The workup has been essentially unremarkable with the exception of wet prep with wbcs.  Will treat with zmax and rocephin.  I have advised her to follow up with her pcp to discuss her implanon, and whether or not this could Nodal the cause of her discomfort and bleeding.  The patient speaks little [place], primarily Burmese.  The history was taken with the assistance of the translator line.   Geoffery Lyons, MD 03/20/14 [phone]

## 2014-03-17 NOTE — ED Notes (Signed)
Pelvic cart set up at bedside  

## 2014-03-17 NOTE — ED Notes (Signed)
Surgery PA at bedside.  

## 2014-03-17 NOTE — ED Notes (Signed)
Notified CT that pt is finished with contrast

## 2014-03-17 NOTE — ED Notes (Signed)
Dr. Effie Shy will see the pt.

## 2014-03-17 NOTE — Consult Note (Signed)
D/W Dr. Judd Lien. Patient examined and I agree with the assessment and plan  Violeta Gelinas, MD, MPH, FACS Trauma: [phone] General Surgery: [phone]  03/17/2014 4:59 PM

## 2014-03-18 LAB — GC/CHLAMYDIA PROBE AMP
CT PROBE, AMP APTIMA: NEGATIVE
GC PROBE AMP APTIMA: NEGATIVE

## 2014-05-01 ENCOUNTER — Other Ambulatory Visit: Payer: Self-pay | Admitting: Physician Assistant

## 2014-05-01 DIAGNOSIS — N6310 Unspecified lump in the right breast, unspecified quadrant: Secondary | ICD-10-CM

## 2014-05-01 DIAGNOSIS — N6320 Unspecified lump in the left breast, unspecified quadrant: Principal | ICD-10-CM

## 2014-05-25 ENCOUNTER — Encounter (HOSPITAL_COMMUNITY): Payer: Self-pay | Admitting: Emergency Medicine

## 2015-01-19 ENCOUNTER — Emergency Department (HOSPITAL_COMMUNITY): Payer: Self-pay

## 2015-01-19 ENCOUNTER — Encounter (HOSPITAL_COMMUNITY): Payer: Self-pay

## 2015-01-19 ENCOUNTER — Emergency Department (HOSPITAL_COMMUNITY)
Admission: EM | Admit: 2015-01-19 | Discharge: 2015-01-19 | Disposition: A | Discharge status: Home / Self Care | Payer: Self-pay | Attending: Emergency Medicine | Admitting: Emergency Medicine

## 2015-01-19 DIAGNOSIS — N83209 Unspecified ovarian cyst, unspecified side: Secondary | ICD-10-CM

## 2015-01-19 DIAGNOSIS — R109 Unspecified abdominal pain: Secondary | ICD-10-CM

## 2015-01-19 DIAGNOSIS — R0602 Shortness of breath: Secondary | ICD-10-CM | POA: Insufficient documentation

## 2015-01-19 DIAGNOSIS — N838 Other noninflammatory disorders of ovary, fallopian tube and broad ligament: Secondary | ICD-10-CM | POA: Insufficient documentation

## 2015-01-19 DIAGNOSIS — Z3202 Encounter for pregnancy test, result negative: Secondary | ICD-10-CM | POA: Insufficient documentation

## 2015-01-19 LAB — CBC WITH DIFFERENTIAL/PLATELET
Basophils Absolute: 0 10*3/uL (ref 0.0–0.1)
Basophils Relative: 0 % (ref 0–1)
Eosinophils Absolute: 0.4 10*3/uL (ref 0.0–0.7)
Eosinophils Relative: 7 % — ABNORMAL HIGH (ref 0–5)
HEMATOCRIT: 41.5 % (ref 36.0–46.0)
Hemoglobin: 14.1 g/dL (ref 12.0–15.0)
LYMPHS PCT: 30 % (ref 12–46)
Lymphs Abs: 1.9 10*3/uL (ref 0.7–4.0)
MCH: 28.4 pg (ref 26.0–34.0)
MCHC: 34 g/dL (ref 30.0–36.0)
MCV: 83.5 fL (ref 78.0–100.0)
MONOS PCT: 5 % (ref 3–12)
Monocytes Absolute: 0.3 10*3/uL (ref 0.1–1.0)
NEUTROS PCT: 58 % (ref 43–77)
Neutro Abs: 3.7 10*3/uL (ref 1.7–7.7)
Platelets: 200 10*3/uL (ref 150–400)
RBC: 4.97 MIL/uL (ref 3.87–5.11)
RDW: 12.8 % (ref 11.5–15.5)
WBC: 6.3 10*3/uL (ref 4.0–10.5)

## 2015-01-19 LAB — URINALYSIS, ROUTINE W REFLEX MICROSCOPIC
BILIRUBIN URINE: NEGATIVE
Glucose, UA: NEGATIVE mg/dL
Ketones, ur: NEGATIVE mg/dL
NITRITE: NEGATIVE
PROTEIN: NEGATIVE mg/dL
SPECIFIC GRAVITY, URINE: 1.006 (ref 1.005–1.030)
UROBILINOGEN UA: 0.2 mg/dL (ref 0.0–1.0)
pH: 6.5 (ref 5.0–8.0)

## 2015-01-19 LAB — COMPREHENSIVE METABOLIC PANEL
ALT: 12 U/L — ABNORMAL LOW (ref 14–54)
ANION GAP: 8 (ref 5–15)
AST: 20 U/L (ref 15–41)
Albumin: 4.1 g/dL (ref 3.5–5.0)
Alkaline Phosphatase: 46 U/L (ref 38–126)
BILIRUBIN TOTAL: 0.5 mg/dL (ref 0.3–1.2)
CHLORIDE: 106 mmol/L (ref 101–111)
CO2: 25 mmol/L (ref 22–32)
CREATININE: 0.63 mg/dL (ref 0.44–1.00)
Calcium: 9.2 mg/dL (ref 8.9–10.3)
GFR calc non Af Amer: >60 mL/min (ref >60)
Glucose, Bld: 99 mg/dL (ref 65–99)
Potassium: 3.5 mmol/L (ref 3.5–5.1)
Sodium: 139 mmol/L (ref 135–145)
Total Protein: 7.9 g/dL (ref 6.5–8.1)

## 2015-01-19 LAB — URINE MICROSCOPIC-ADD ON

## 2015-01-19 LAB — PREGNANCY, URINE: PREG TEST UR: NEGATIVE

## 2015-01-19 MED ORDER — IOHEXOL 300 MG/ML  SOLN
25.0000 mL | INTRAMUSCULAR | Status: AC
  Administered 2015-01-19: 25 mL via ORAL

## 2015-01-19 MED ORDER — IOHEXOL 300 MG/ML  SOLN
75.0000 mL | Freq: Once | INTRAMUSCULAR | Status: AC | PRN
  Administered 2015-01-19: 75 mL via INTRAVENOUS

## 2015-01-19 MED ORDER — SODIUM CHLORIDE 0.9 % IV BOLUS (SEPSIS)
1000.0000 mL | Freq: Once | INTRAVENOUS | Status: AC
  Administered 2015-01-19: 1000 mL via INTRAVENOUS

## 2015-01-19 MED ORDER — HYDROCODONE-ACETAMINOPHEN 5-325 MG PO TABS: 1.0000 | ORAL_TABLET | Freq: Four times a day (QID) | ORAL | Status: DC | PRN

## 2015-01-19 NOTE — ED Provider Notes (Signed)
CSN: [id number]     Arrival date & time 01/19/15  0944 History   First MD Initiated Contact with Patient 01/19/15 [phone]     Chief Complaint  Patient presents with  . Abdominal Pain  . Shortness of Breath     (Consider location/radiation/quality/duration/timing/severity/associated sxs/prior Treatment) Patient is a 24 y.o. female presenting with abdominal pain and shortness of breath. The history is provided by the patient. A language interpreter was used.  Abdominal Pain Pain location:  Generalized Pain quality: bloating   Pain radiates to:  Does not radiate Pain severity:  Moderate Onset quality:  Gradual Duration:  8 weeks Timing:  Constant Progression:  Unchanged Chronicity:  Recurrent Context: not diet changes and not sick contacts   Worsened by:  Nothing tried Ineffective treatments:  None tried Associated symptoms: shortness of breath   Associated symptoms: no cough, no fever and no vomiting   Shortness of Breath Associated symptoms: no abdominal pain, no cough, no fever and no vomiting     Past Medical History  Diagnosis Date  . No pertinent past medical history   . Medical history non-contributory    Past Surgical History  Procedure Laterality Date  . Cesarean section     Family History  Problem Relation Age of Onset  . Other Neg Hx    History  Substance Use Topics  . Smoking status: Never Smoker   . Smokeless tobacco: Never Used  . Alcohol Use: No   OB History    Gravida Para Term Preterm AB TAB SAB Ectopic Multiple Living   Review of Systems  Constitutional: Negative for fever.  Respiratory: Positive for shortness of breath. Negative for cough.   Gastrointestinal: Negative for vomiting and abdominal pain.  All other systems reviewed and are negative.     Allergies  Review of patient's allergies indicates no known allergies.  Home Medications   Prior to Admission medications   Medication Sig Start Date End Date Taking?  Authorizing Provider  acetaminophen (TYLENOL) 325 MG tablet Take 650 mg by mouth every 6 (six) hours as needed (pain).    Historical Provider, MD  HYDROcodone-acetaminophen (NORCO) 5-325 MG per tablet Take 1-2 tablets by mouth every 6 (six) hours as needed. 03/17/14   Geoffery Lyons, MD  ondansetron (ZOFRAN) 8 MG tablet Take 1 tablet (8 mg total) by mouth every 8 (eight) hours as needed for nausea or vomiting. 03/17/14   Mancel Bale, MD   BP 117/81 mmHg  Pulse 88  Temp(Src) 98.3 F (36.8 C) (Oral)  Resp 20  SpO2 98%  LMP 01/19/2015 Physical Exam  Constitutional: She is oriented to person, place, and time. She appears well-developed and well-nourished. No distress.  HENT:  Head: Normocephalic and atraumatic.  Mouth/Throat: Oropharynx is clear and moist.  Eyes: EOM are normal. Pupils are equal, round, and reactive to light.  Neck: Normal range of motion. Neck supple.  Cardiovascular: Normal rate and regular rhythm.  Exam reveals no friction rub.   No murmur heard. Pulmonary/Chest: Effort normal and breath sounds normal. No respiratory distress. She has no wheezes. She has no rales.  Abdominal: Soft. She exhibits no distension. There is tenderness (central). There is no rebound.  Musculoskeletal: Normal range of motion. She exhibits no edema.  Neurological: She is alert and oriented to person, place, and time. No cranial nerve deficit. She exhibits normal muscle tone. Coordination normal.  Skin: No rash noted. She is  not diaphoretic.  Nursing note and vitals reviewed.   ED Course  Procedures (including critical care time) Labs Review Labs Reviewed  CBC WITH DIFFERENTIAL/PLATELET  COMPREHENSIVE METABOLIC PANEL  PREGNANCY, URINE  URINALYSIS, ROUTINE W REFLEX MICROSCOPIC (NOT AT [facility])    Imaging Review Dg Chest 2 View  01/19/2015   CLINICAL DATA:  Shortness of breath, umbilical pain for 1 month  EXAM: CHEST  2 VIEW  COMPARISON:  None.  FINDINGS: The heart size and mediastinal  contours are within normal limits. Both lungs are clear. The visualized skeletal structures are unremarkable.  IMPRESSION: No active cardiopulmonary disease.   Electronically Signed   By: Elige KoHetal  Patel   On: 01/19/2015 12:00   Ct Abdomen Pelvis W Contrast  01/19/2015   CLINICAL DATA:  Central abdominal pain  EXAM: CT ABDOMEN AND PELVIS WITH CONTRAST  TECHNIQUE: Multidetector CT imaging of the abdomen and pelvis was performed using the standard protocol following bolus administration of intravenous contrast. Oral contrast was also administered.  CONTRAST:  75mL OMNIPAQUE IOHEXOL 300 MG/ML  SOLN  COMPARISON:  March 17, 2014  FINDINGS: Lung bases are clear.  No focal liver lesions are identified. Gallbladder wall is not appreciably thickened. There is no biliary duct dilatation.  Spleen, pancreas, and adrenals appear normal. Kidneys bilaterally show no mass or hydronephrosis on either side. There is no renal or ureteral calculus on either side.  In the pelvis, urinary bladder is midline with normal wall thickness. There is an intrauterine device within the uterus. There is a small amount of free fluid in the cul-de-sac. Fluid is seen tracking from the right ovary to the cul-de-sac. There is a cystic area in the right ovary with an enhancing rim measuring 2.0 x 1.6 cm. No other pelvic mass is appreciable. Appendix is not seen. There is no periappendiceal region inflammation. There are multiple rather prominent periuterine vascular structures, slightly more on the right than on the left.  There is no bowel obstruction. No free air or portal venous air. There is no appreciable adenopathy or abscess in the abdomen or pelvis. There is no abdominal aortic aneurysm. There are no blastic or lytic bone lesions.  IMPRESSION: Evidence suggesting recent ovarian cyst rupture on the right with enhancement of the wall of the cystic structure in the right ovary and fluid tracking from the right ovary into the cul-de-sac.  Prominent  periuterine vascular structures are noted. In the appropriate clinical setting, a degree of pelvic congestion syndrome could present in this manner.  Intrauterine device in pelvis.  Appendix not seen.  No periappendiceal region appreciable.  No abscess. No bowel obstruction. No renal or ureteral calculus. No hydronephrosis.   Electronically Signed   By: Bretta BangWilliam  Woodruff III M.D.   On: 01/19/2015 13:58     EKG Interpretation   Date/Time:  Tuesday January 19 2015 09:54:38 EDT Ventricular Rate:  92 PR Interval:  140 QRS Duration: 76 QT Interval:  336 QTC Calculation: 415 R Axis:   95 Text Interpretation:  Normal sinus rhythm Rightward axis Borderline ECG No  prior for comparison Confirmed by Gwendolyn GrantWALDEN  MD, Marquetta Weiskopf ([id number]) on 01/19/2015  9:59:10 AM      MDM   Final diagnoses:  Central abdominal pain  Ovarian cyst rupture    History 24-year-old female here with abdominal pain for 2 months. Feels like bloating. She states it somewhat of belly pain she had last year. At that time she had a CT there was possible appendicitis. She was seen by  surgery and deemed not to have appendicitis a CT to them appeared fairly normal and she had no right lower quadrant pain. Showed normal pelvic ultrasound. She did she states some occasional fevers but has not checked her temperature. No radiation of the pain, no dysuria now, but did have some last week. Here vitals are stable. Mild central abdominal pain. No lower quadrant abdominal pain. Last menstrual period is now. We'll plan for CT of her belly.  She's also been SOB for the past week. Lungs are clear. No chest pain. No cough or URI symptoms. Will xray.   Patient's CT shows ovarian cyst that has ruptured. Likely the cause of her symptoms. Given [facility] follow-up.  Elwin Mocha, MD 01/19/15 [phone]

## 2015-01-19 NOTE — ED Notes (Signed)
Pt here in triage c/o lower abd pain for 1 month and SOB for 2 weeks when she moves. The pain increases at night. Brother in triage with pt translating with pt. Pt speaks burmese. Has had nausea but no vomiting. Pt hasn't had any diarrhea. Pt started her period yesterday

## 2015-01-19 NOTE — Discharge Instructions (Signed)
Abdominal Pain, Women  Abdominal (stomach, pelvic, or belly) pain can Vanc[place] caused by many things. It is important to tell your doctor:   The location of the pain.   Does it come and go or is it present all the time?   Are there things that start the pain (eating certain foods, exercise)?   Are there other symptoms associated with the pain (fever, nausea, vomiting, diarrhea)?  All of this is helpful to know when trying to find the cause of the pain.  CAUSES    Stomach: virus or bacteria infection, or ulcer.   Intestine: appendicitis (inflamed appendix), regional ileitis (Crohn's disease), ulcerative colitis (inflamed colon), irritable bowel syndrome, diverticulitis (inflamed diverticulum of the colon), or cancer of the stomach or intestine.   Gallbladder disease or stones in the gallbladder.   Kidney disease, kidney stones, or infection.   Pancreas infection or cancer.   Fibromyalgia (pain disorder).   Diseases of the female organs:   Uterus: fibroid (non-cancerous) tumors or infection.   Fallopian tubes: infection or tubal pregnancy.   Ovary: cysts or tumors.   Pelvic adhesions (scar tissue).   Endometriosis (uterus lining tissue growing in the pelvis and on the pelvic organs).   Pelvic congestion syndrome (female organs filling up with blood just before the menstrual period).   Pain with the menstrual period.   Pain with ovulation (producing an egg).   Pain with an IUD (intrauterine device, birth control) in the uterus.   Cancer of the female organs.   Functional pain (pain not caused by a disease, may improve without treatment).   Psychological pain.   Depression.  DIAGNOSIS   Your doctor will decide the seriousness of your pain by doing an examination.   Blood tests.   X-rays.   Ultrasound.   CT scan (computed tomography, special type of X-ray).   MRI (magnetic resonance imaging).   Cultures, for infection.   Barium enema (dye inserted in the large intestine, to better view it with  X-rays).   Colonoscopy (looking in intestine with a lighted tube).   Laparoscopy (minor surgery, looking in abdomen with a lighted tube).   Major abdominal exploratory surgery (looking in abdomen with a large incision).  TREATMENT   The treatment will depend on the cause of the pain.    Many cases can Overholser observed and treated at home.   Over-the-counter medicines recommended by your caregiver.   Prescription medicine.   Antibiotics, for infection.   Birth control pills, for painful periods or for ovulation pain.   Hormone treatment, for endometriosis.   Nerve blocking injections.   Physical therapy.   Antidepressants.   Counseling with a psychologist or psychiatrist.   Minor or major surgery.  HOME CARE INSTRUCTIONS    Do not take laxatives, unless directed by your caregiver.   Take over-the-counter pain medicine only if ordered by your caregiver. Do not take aspirin because it can cause an upset stomach or bleeding.   Try a clear liquid diet (broth or water) as ordered by your caregiver. Slowly move to a bland diet, as tolerated, if the pain is related to the stomach or intestine.   Have a thermometer and take your temperature several times a day, and record it.   Bed rest and sleep, if it helps the pain.   Avoid sexual intercourse, if it causes pain.   Avoid stressful situations.   Keep your follow-up appointments and tests, as your caregiver orders.   If the pain does   not go away with medicine or surgery, you may try:   Acupuncture.   Relaxation exercises (yoga, meditation).   Group therapy.   Counseling.  SEEK MEDICAL CARE IF:    You notice certain foods cause stomach pain.   Your home care treatment is not helping your pain.   You need stronger pain medicine.   You want your IUD removed.   You feel faint or lightheaded.   You develop nausea and vomiting.   You develop a rash.   You are having side effects or an allergy to your medicine.  SEEK IMMEDIATE MEDICAL CARE IF:    Your  pain does not go away or gets worse.   You have a fever.   Your pain is felt only in portions of the abdomen. The right side could possibly Sabedra appendicitis. The left lower portion of the abdomen could Hamm colitis or diverticulitis.   You are passing blood in your stools (bright red or black tarry stools, with or without vomiting).   You have blood in your urine.   You develop chills, with or without a fever.   You pass out.  MAKE SURE YOU:    Understand these instructions.   Will watch your condition.   Will get help right away if you are not doing well or get worse.  Document Released: 05/07/2007 Document Revised: 11/24/2013 Document Reviewed: 05/27/2009  ExitCare Patient Information 2015 [employer]. This information is not intended to replace advice given to you by your health care provider. Make sure you discuss any questions you have with your health care provider.          Ovarian Cyst  An ovarian cyst is a fluid-filled sac that forms on an ovary. The ovaries are small organs that produce eggs in women. Various types of cysts can form on the ovaries. Most are not cancerous. Many do not cause problems, and they often go away on their own. Some may cause symptoms and require treatment. Common types of ovarian cysts include:   Functional cysts--These cysts may occur every month during the menstrual cycle. This is normal. The cysts usually go away with the next menstrual cycle if the woman does not get pregnant. Usually, there are no symptoms with a functional cyst.   Endometrioma cysts--These cysts form from the tissue that lines the uterus. They are also called "chocolate cysts" because they become filled with blood that turns brown. This type of cyst can cause pain in the lower abdomen during intercourse and with your menstrual period.   Cystadenoma cysts--This type develops from the cells on the outside of the ovary. These cysts can get very big and cause lower abdomen pain and pain with  intercourse. This type of cyst can twist on itself, cut off its blood supply, and cause severe pain. It can also easily rupture and cause a lot of pain.   Dermoid cysts--This type of cyst is sometimes found in both ovaries. These cysts may contain different kinds of body tissue, such as skin, teeth, hair, or cartilage. They usually do not cause symptoms unless they get very big.   Theca lutein cysts--These cysts occur when too much of a certain hormone (human chorionic gonadotropin) is produced and overstimulates the ovaries to produce an egg. This is most common after procedures used to assist with the conception of a baby (in vitro fertilization).  CAUSES    Fertility drugs can cause a condition in which multiple large cysts are formed on the   ovaries. This is called ovarian hyperstimulation syndrome.   A condition called polycystic ovary syndrome can cause hormonal imbalances that can lead to nonfunctional ovarian cysts.  SIGNS AND SYMPTOMS   Many ovarian cysts do not cause symptoms. If symptoms are present, they may include:   Pelvic pain or pressure.   Pain in the lower abdomen.   Pain during sexual intercourse.   Increasing girth (swelling) of the abdomen.   Abnormal menstrual periods.   Increasing pain with menstrual periods.   Stopping having menstrual periods without being pregnant.  DIAGNOSIS   These cysts are commonly found during a routine or annual pelvic exam. Tests may Obeirne ordered to find out more about the cyst. These tests may include:   Ultrasound.   X-ray of the pelvis.   CT scan.   MRI.   Blood tests.  TREATMENT   Many ovarian cysts go away on their own without treatment. Your health care provider may want to check your cyst regularly for 2-3 months to see if it changes. For women in menopause, it is particularly important to monitor a cyst closely because of the higher rate of ovarian cancer in menopausal women. When treatment is needed, it may include any of the following:   A  procedure to drain the cyst (aspiration). This may Muriel done using a long needle and ultrasound. It can also Packard done through a laparoscopic procedure. This involves using a thin, lighted tube with a tiny camera on the end (laparoscope) inserted through a small incision.   Surgery to remove the whole cyst. This may [place] done using laparoscopic surgery or an open surgery involving a larger incision in the lower abdomen.   Hormone treatment or birth control pills. These methods are sometimes used to help dissolve a cyst.  HOME CARE INSTRUCTIONS    Only take over-the-counter or prescription medicines as directed by your health care provider.   Follow up with your health care provider as directed.   Get regular pelvic exams and Pap tests.  SEEK MEDICAL CARE IF:    Your periods are late, irregular, or painful, or they stop.   Your pelvic pain or abdominal pain does not go away.   Your abdomen becomes larger or swollen.   You have pressure on your bladder or trouble emptying your bladder completely.   You have pain during sexual intercourse.   You have feelings of fullness, pressure, or discomfort in your stomach.   You lose weight for no apparent reason.   You feel generally ill.   You become constipated.   You lose your appetite.   You develop acne.   You have an increase in body and facial hair.   You are gaining weight, without changing your exercise and eating habits.   You think you are pregnant.  SEEK IMMEDIATE MEDICAL CARE IF:    You have increasing abdominal pain.   You feel sick to your stomach (nauseous), and you throw up (vomit).   You develop a fever that comes on suddenly.   You have abdominal pain during a bowel movement.   Your menstrual periods become heavier than usual.  MAKE SURE YOU:   Understand these instructions.   Will watch your condition.   Will get help right away if you are not doing well or get worse.  Document Released: 07/10/2005 Document Revised: 07/15/2013 Document  Reviewed: 03/17/2013  ExitCare Patient Information 2015 [employer]. This information is not intended to replace advice given to you by   your health care provider. Make sure you discuss any questions you have with your health care provider.

## 2015-02-16 ENCOUNTER — Ambulatory Visit: Payer: Self-pay | Attending: Internal Medicine

## 2015-02-17 ENCOUNTER — Ambulatory Visit: Payer: Self-pay | Attending: Internal Medicine

## 2015-03-04 ENCOUNTER — Ambulatory Visit: Payer: Self-pay

## 2015-05-18 ENCOUNTER — Encounter (HOSPITAL_COMMUNITY): Payer: Self-pay | Admitting: Emergency Medicine

## 2015-05-18 ENCOUNTER — Emergency Department (INDEPENDENT_AMBULATORY_CARE_PROVIDER_SITE_OTHER)
Admission: EM | Admit: 2015-05-18 | Discharge: 2015-05-18 | Disposition: A | Discharge status: Home / Self Care | Payer: Self-pay | Source: Home / Self Care | Attending: Emergency Medicine | Admitting: Emergency Medicine

## 2015-05-18 DIAGNOSIS — N643 Galactorrhea not associated with childbirth: Secondary | ICD-10-CM

## 2015-05-18 DIAGNOSIS — N6011 Diffuse cystic mastopathy of right breast: Secondary | ICD-10-CM

## 2015-05-18 DIAGNOSIS — M549 Dorsalgia, unspecified: Secondary | ICD-10-CM

## 2015-05-18 DIAGNOSIS — N6012 Diffuse cystic mastopathy of left breast: Secondary | ICD-10-CM

## 2015-05-18 LAB — POCT URINALYSIS DIP (DEVICE)
BILIRUBIN URINE: NEGATIVE
Glucose, UA: NEGATIVE mg/dL
Ketones, ur: NEGATIVE mg/dL
NITRITE: NEGATIVE
Protein, ur: NEGATIVE mg/dL
SPECIFIC GRAVITY, URINE: 1.01 (ref 1.005–1.030)
Urobilinogen, UA: 0.2 mg/dL (ref 0.0–1.0)
pH: 7 (ref 5.0–8.0)

## 2015-05-18 LAB — POCT PREGNANCY, URINE: Preg Test, Ur: NEGATIVE

## 2015-05-18 MED ORDER — IBUPROFEN 600 MG PO TABS: 600.0000 mg | ORAL_TABLET | Freq: Four times a day (QID) | ORAL | Status: DC | PRN

## 2015-05-18 NOTE — ED Provider Notes (Signed)
CSN: [id number]     Arrival date & time 05/18/15  1415 History   First MD Initiated Contact with Patient 05/18/15 1452     Chief Complaint  Patient presents with  . Breast Pain  . Back Pain   (Consider location/radiation/quality/duration/timing/severity/associated sxs/prior Treatment) HPI  She is a 24 year old woman here for evaluation of rest pain. Her brother is with her and acts as interpreter.  She reports a five-day history of bilateral breast pain. She reports large tender lumps in the nipple area bilaterally. She also reports a thick white discharge from both breasts. Last menstrual period was September 28. She denies any possibility of pregnancy.  No nausea or vomiting. No urinary symptoms. No vaginal symptoms.  She has no history of similar symptoms.  She also reports a 1-2 week history of mid back pain. It does not radiate. It is worse on the right side. She denies any injury or trauma. No change in activity.  Past Medical History  Diagnosis Date  . No pertinent past medical history   . Medical history non-contributory    Past Surgical History  Procedure Laterality Date  . Cesarean section     Family History  Problem Relation Age of Onset  . Other Neg Hx    Social History  Substance Use Topics  . Smoking status: Never Smoker   . Smokeless tobacco: Never Used  . Alcohol Use: No   OB History    Gravida Para Term Preterm AB TAB SAB Ectopic Multiple Living   Review of Systems As in history of present illness Allergies  Review of patient's allergies indicates no known allergies.  Home Medications   Prior to Admission medications   Medication Sig Start Date End Date Taking? Authorizing Provider  acetaminophen (TYLENOL) 325 MG tablet Take 650 mg by mouth every 6 (six) hours as needed (pain).    Historical Provider, MD  HYDROcodone-acetaminophen (NORCO) 5-325 MG per tablet Take 1 tablet by mouth every 6 (six) hours as needed for moderate pain.  01/19/15   Elwin Mocha, MD  ibuprofen (ADVIL,MOTRIN) 600 MG tablet Take 1 tablet (600 mg total) by mouth every 6 (six) hours as needed for moderate pain. 05/18/15   Charm Rings, MD  mometasone (NASONEX) 50 MCG/ACT nasal spray Place 2 sprays into the nose daily. 02/02/14 02/02/15  Historical Provider, MD   Meds Ordered and Administered this Visit  Medications - No data to display  BP 101/72 mmHg  Pulse 91  Temp(Src) 98.1 F (36.7 C) (Oral)  Resp 16  SpO2 100%  LMP 04/16/2015 (Exact Date) No data found.   Physical Exam  Constitutional: She is oriented to person, place, and time. She appears well-developed and well-nourished. No distress.  Cardiovascular: Normal rate.   Pulmonary/Chest: Effort normal.  Genitourinary:  Right breast: No erythema or skin changes. She has a tender, fibrous, mobile nodule just underneath the nipple. I'm unable to express any discharge. Left breast: No erythema or skin changes. She has a tender, fibrous, mobile nodule just underneath the nipple. I'm unable to express any discharge.  Musculoskeletal:  Back: No erythema or edema. No vertebral tenderness or step-offs. She is tender to the paraspinous musculature of the thoracic back bilaterally.  Lymphadenopathy:       Right axillary: No pectoral and no lateral adenopathy present.       Left axillary: No pectoral and no lateral adenopathy present. Neurological: She is  alert and oriented to person, place, and time.    ED Course  Procedures (including critical care time)  Labs Review Labs Reviewed  POCT URINALYSIS DIP (DEVICE) - Abnormal; Notable for the following:    Hgb urine dipstick SMALL (*)    Leukocytes, UA TRACE (*)    All other components within normal limits  POCT PREGNANCY, URINE    Imaging Review No results found.     MDM   1. Fibrocystic breast changes of both breasts   2. Galactorrhea of both breasts   3. Mid back pain    Pregnancy test is negative. Exam is most consistent with  fibrocystic changes. She does have what sounds like galactorrhea, but I am unable to express any discharge. I suspect this is all related to her menstrual cycle.  We'll treat symptomatically with ibuprofen. If her symptoms persist, [facility]. Back pain seems to Drennen coming from this paraspinous musculature. Discussed ibuprofen and heat. Follow-up if not improving in 1 week.    Charm RingsErin J Erisha Paugh, MD 05/18/15 [id number]

## 2015-05-18 NOTE — Discharge Instructions (Signed)
You have fibrocystic changes in your breast. The fluid from your breast is breast milk. This should resolve in the next week as your period starts. Take ibuprofen 600 mg every 6 hours as needed for pain. [facility] if your symptoms are not improving.  Your back pain is coming from the muscles in your back. The ibuprofen will help with this as well. Apply heat to your back at least 3 times a day. If this is not getting better in the next week, please follow-up here.

## 2015-05-18 NOTE — ED Notes (Addendum)
Pt reports feeling a lump in both breasts with pain.  She states she feels like there is fluid in them.  She also reports some mid back pain and feeling like she has a fever and fatigue along with some intermittent nausea.  Pt speaks Burmese and is here with her brother who speaks [place].

## 2015-06-24 ENCOUNTER — Ambulatory Visit: Payer: Medicaid Other | Attending: Family Medicine | Admitting: Family Medicine

## 2015-06-24 ENCOUNTER — Encounter: Payer: Self-pay | Admitting: Family Medicine

## 2015-06-24 VITALS — BP 113/73 | HR 85 | Temp 98.5°F | Resp 16 | Ht 59.5 in | Wt 100.0 lb

## 2015-06-24 DIAGNOSIS — Z0189 Encounter for other specified special examinations: Secondary | ICD-10-CM | POA: Insufficient documentation

## 2015-06-24 DIAGNOSIS — Z0000 Encounter for general adult medical examination without abnormal findings: Secondary | ICD-10-CM

## 2015-06-24 LAB — COMPLETE METABOLIC PANEL WITH GFR
ALBUMIN: 4.3 g/dL (ref 3.6–5.1)
ALT: 12 U/L (ref 6–29)
AST: 15 U/L (ref 10–30)
Alkaline Phosphatase: 43 U/L (ref 33–115)
BILIRUBIN TOTAL: 0.6 mg/dL (ref 0.2–1.2)
BUN: 12 mg/dL (ref 7–25)
CALCIUM: 9.4 mg/dL (ref 8.6–10.2)
CO2: 28 mmol/L (ref 20–31)
CREATININE: 0.56 mg/dL (ref 0.50–1.10)
Chloride: 103 mmol/L (ref 98–110)
GFR, Est African American: >89 mL/min (ref >=60)
GFR, Est Non African American: >89 mL/min (ref >=60)
GLUCOSE: 85 mg/dL (ref 65–99)
Potassium: 5 mmol/L (ref 3.5–5.3)
SODIUM: 138 mmol/L (ref 135–146)
TOTAL PROTEIN: 7.5 g/dL (ref 6.1–8.1)

## 2015-06-24 LAB — POCT URINALYSIS DIPSTICK
BILIRUBIN UA: NEGATIVE
GLUCOSE UA: NEGATIVE
Ketones, UA: NEGATIVE
LEUKOCYTES UA: NEGATIVE
Nitrite, UA: NEGATIVE
Protein, UA: NEGATIVE
Spec Grav, UA: 1.02
Urobilinogen, UA: 0.2
pH, UA: 7

## 2015-06-24 LAB — CBC
HCT: 39.2 % (ref 36.0–46.0)
Hemoglobin: 13.2 g/dL (ref 12.0–15.0)
MCH: 28 pg (ref 26.0–34.0)
MCHC: 33.7 g/dL (ref 30.0–36.0)
MCV: 83.1 fL (ref 78.0–100.0)
MPV: 9.4 fL (ref 8.6–12.4)
Platelets: 253 10*3/uL (ref 150–400)
RBC: 4.72 MIL/uL (ref 3.87–5.11)
RDW: 14.5 % (ref 11.5–15.5)
WBC: 8.5 10*3/uL (ref 4.0–10.5)

## 2015-06-24 LAB — POCT URINE PREGNANCY: Preg Test, Ur: NEGATIVE

## 2015-06-24 NOTE — Progress Notes (Signed)
Patient ID: Teresa [name], female   DOB: 02/28/1991, 24 y.o.   MRN: [id number]   Subjective:  Patient ID: Teresa [name], female    DOB: 10/17/1990  Age: 24 y.o. MRN: [id number]  CC: Establish Care   HPI Teresa [name] presents for establish care, wellness visit. She has no acute complaints or concerns other than requesting check of kidneys. She speaks burmese but prefers not to use the interpreter. She is amenable to pap but does not have time today as she has to pick up her baby.   Past Medical History  Diagnosis Date  . No pertinent past medical history   . Medical history non-contributory     Past Surgical History  Procedure Laterality Date  . Cesarean section      Family History  Problem Relation Age of Onset  . Other Neg Hx   . Hypertension Mother     Social History  Substance Use Topics  . Smoking status: Never Smoker   . Smokeless tobacco: Never Used  . Alcohol Use: No    ROS Review of Systems  Constitutional: Negative for fever and chills.  Eyes: Negative for visual disturbance.  Respiratory: Negative for shortness of breath.   Cardiovascular: Negative for chest pain.  Gastrointestinal: Negative for abdominal pain and blood in stool.  Musculoskeletal: Negative for back pain and arthralgias.  Skin: Negative for rash.  Allergic/Immunologic: Negative for immunocompromised state.  Hematological: Negative for adenopathy. Does not bruise/bleed easily.  Psychiatric/Behavioral: Negative for suicidal ideas and dysphoric mood.    Objective:   Today's Vitals: BP 113/73 mmHg  Pulse 85  Temp(Src) 98.5 F (36.9 C) (Oral)  Resp 16  Ht 4' 11.5" (1.511 m)  Wt 100 lb (45.36 kg)  BMI 19.87 kg/m2  SpO2 100%  LMP 06/18/2015  Physical Exam  Constitutional: She is oriented to person, place, and time. She appears well-developed and well-nourished. No distress.  HENT:  Head: Normocephalic and atraumatic.  Pulmonary/Chest: Effort normal.  Neurological: She is alert and oriented to person,  place, and time.  Skin: Skin is warm and dry. No rash noted.  Psychiatric: She has a normal mood and affect.   UA negative  U preg negative   Assessment & Plan:   Teresa [name] was seen today for establish care.  Diagnoses and all orders for this visit:  Wellness examination -     POCT urinalysis dipstick -     POCT urine pregnancy -     COMPLETE METABOLIC PANEL WITH GFR -     CBC  Other orders -     Cancel: Cytology - PAP Teresa [name]    Outpatient Encounter Prescriptions as of 06/24/2015  Medication Sig  . acetaminophen (TYLENOL) 325 MG tablet Take 650 mg by mouth every 6 (six) hours as needed (pain).  Marland Kitchen. HYDROcodone-acetaminophen (NORCO) 5-325 MG per tablet Take 1 tablet by mouth every 6 (six) hours as needed for moderate pain. (Patient not taking: Reported on 06/24/2015)  . ibuprofen (ADVIL,MOTRIN) 600 MG tablet Take 1 tablet (600 mg total) by mouth every 6 (six) hours as needed for moderate pain. (Patient not taking: Reported on 06/24/2015)  . mometasone (NASONEX) 50 MCG/ACT nasal spray Place 2 sprays into the nose daily.   No facility-administered encounter medications on file as of 06/24/2015.    Follow-up: No Follow-up on file.    Dessa PhiJosalyn Sherrell Weir MD

## 2015-06-24 NOTE — Progress Notes (Signed)
Check up No problems no pain today  No tobacco user  No suicide thought in the past two weeks   Used [employer] Burmese [phone]#[id number]

## 2015-06-24 NOTE — Patient Instructions (Signed)
Teresa [name] was seen today for establish care.  Diagnoses and all orders for this visit:  Wellness examination -     POCT urinalysis dipstick -     POCT urine pregnancy -     COMPLETE METABOLIC PANEL WITH GFR -     CBC  Other orders -     Cancel: Cytology - PAP Teresa [name]   You will Teresa [name] called with lab results  F/u in 4-6 weeks for pap smear  Dr. Armen PickupFunches

## 2015-06-28 ENCOUNTER — Telehealth: Payer: Self-pay | Admitting: *Deleted

## 2015-06-28 NOTE — Telephone Encounter (Signed)
-----   Message from Dessa PhiJosalyn Funches, MD sent at 06/25/2015  9:05 AM EST ----- Normal CBC and CMP

## 2015-06-28 NOTE — Telephone Encounter (Signed)
LVM to return call.

## 2015-07-05 NOTE — Telephone Encounter (Signed)
Used [employer] Interpreter Burmese [phone]#[id number] LVM x2

## 2017-06-19 IMAGING — DX DG CHEST 2V
2 series · 2 of 2 positions shown · non-contrast
Comparison: None.

CLINICAL DATA: Shortness of breath, umbilical pain for 1 month

EXAM:
CHEST  2 VIEW

[chest pa]
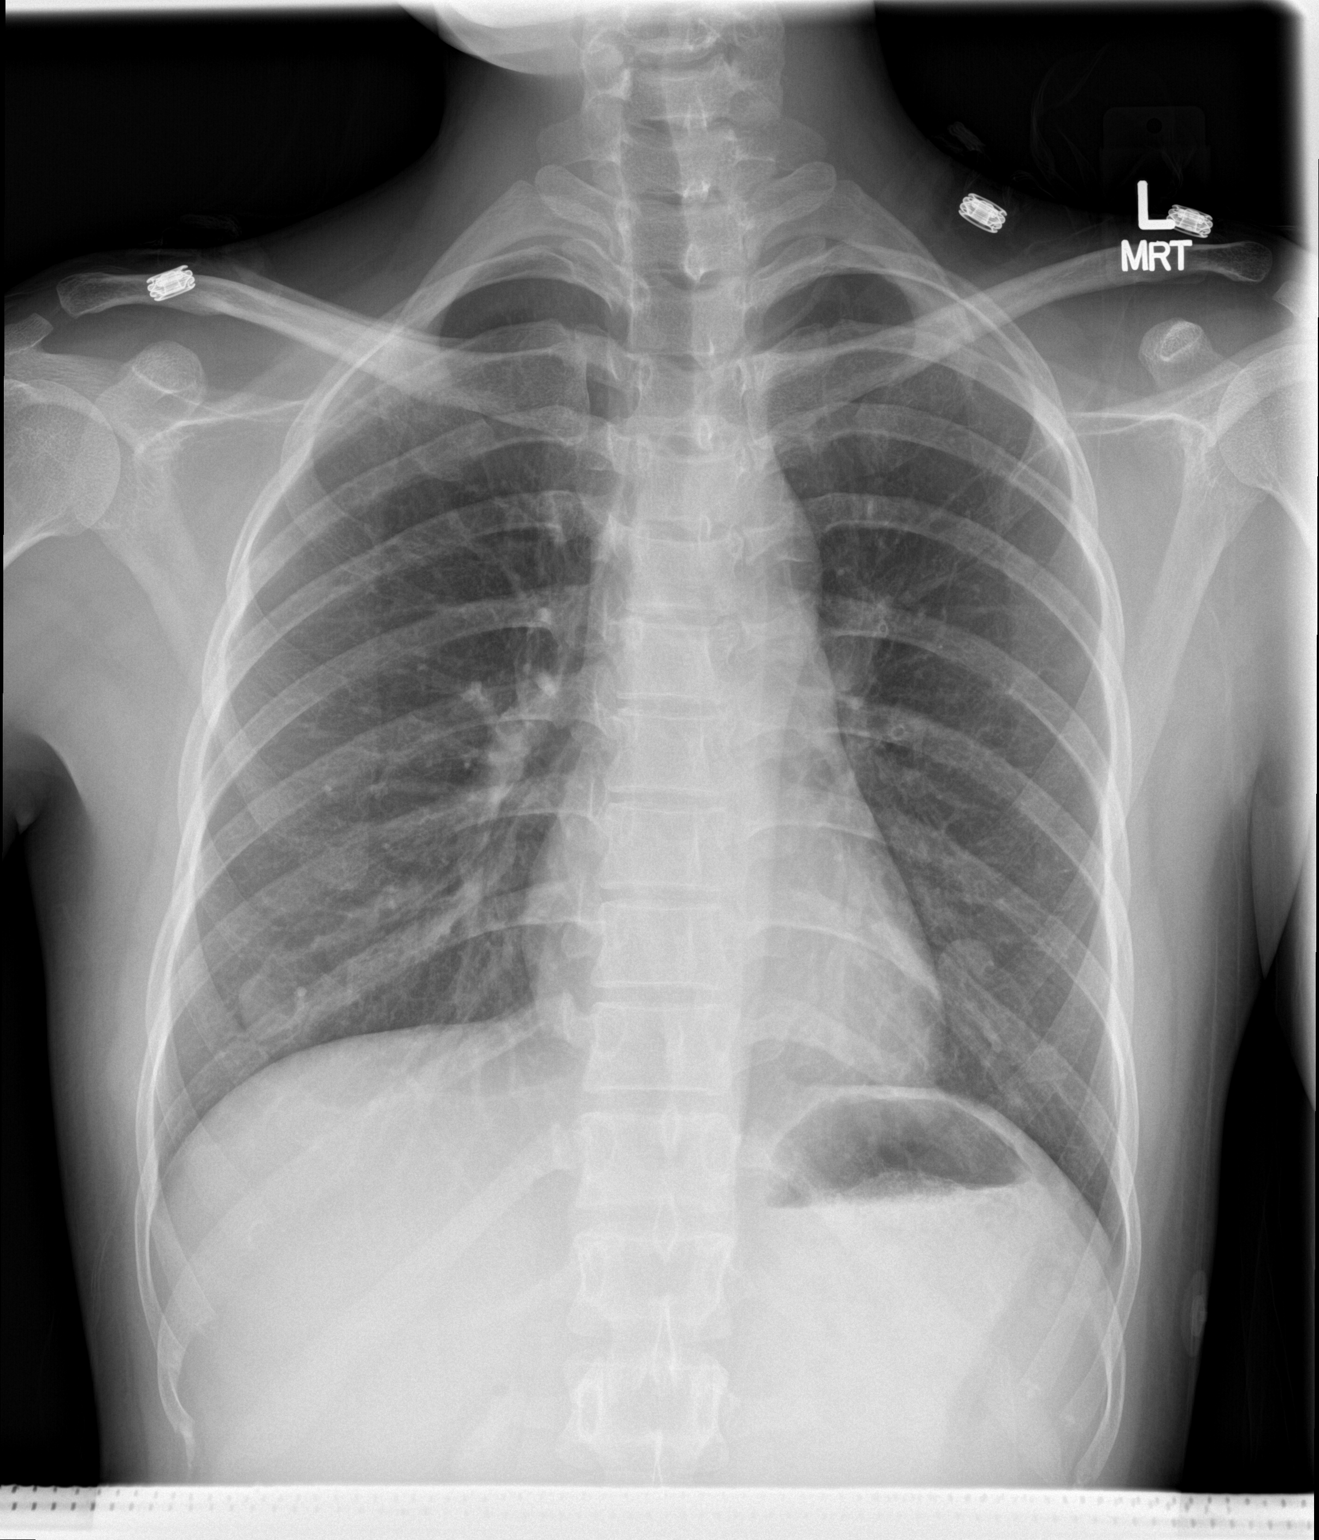

[chest lat]
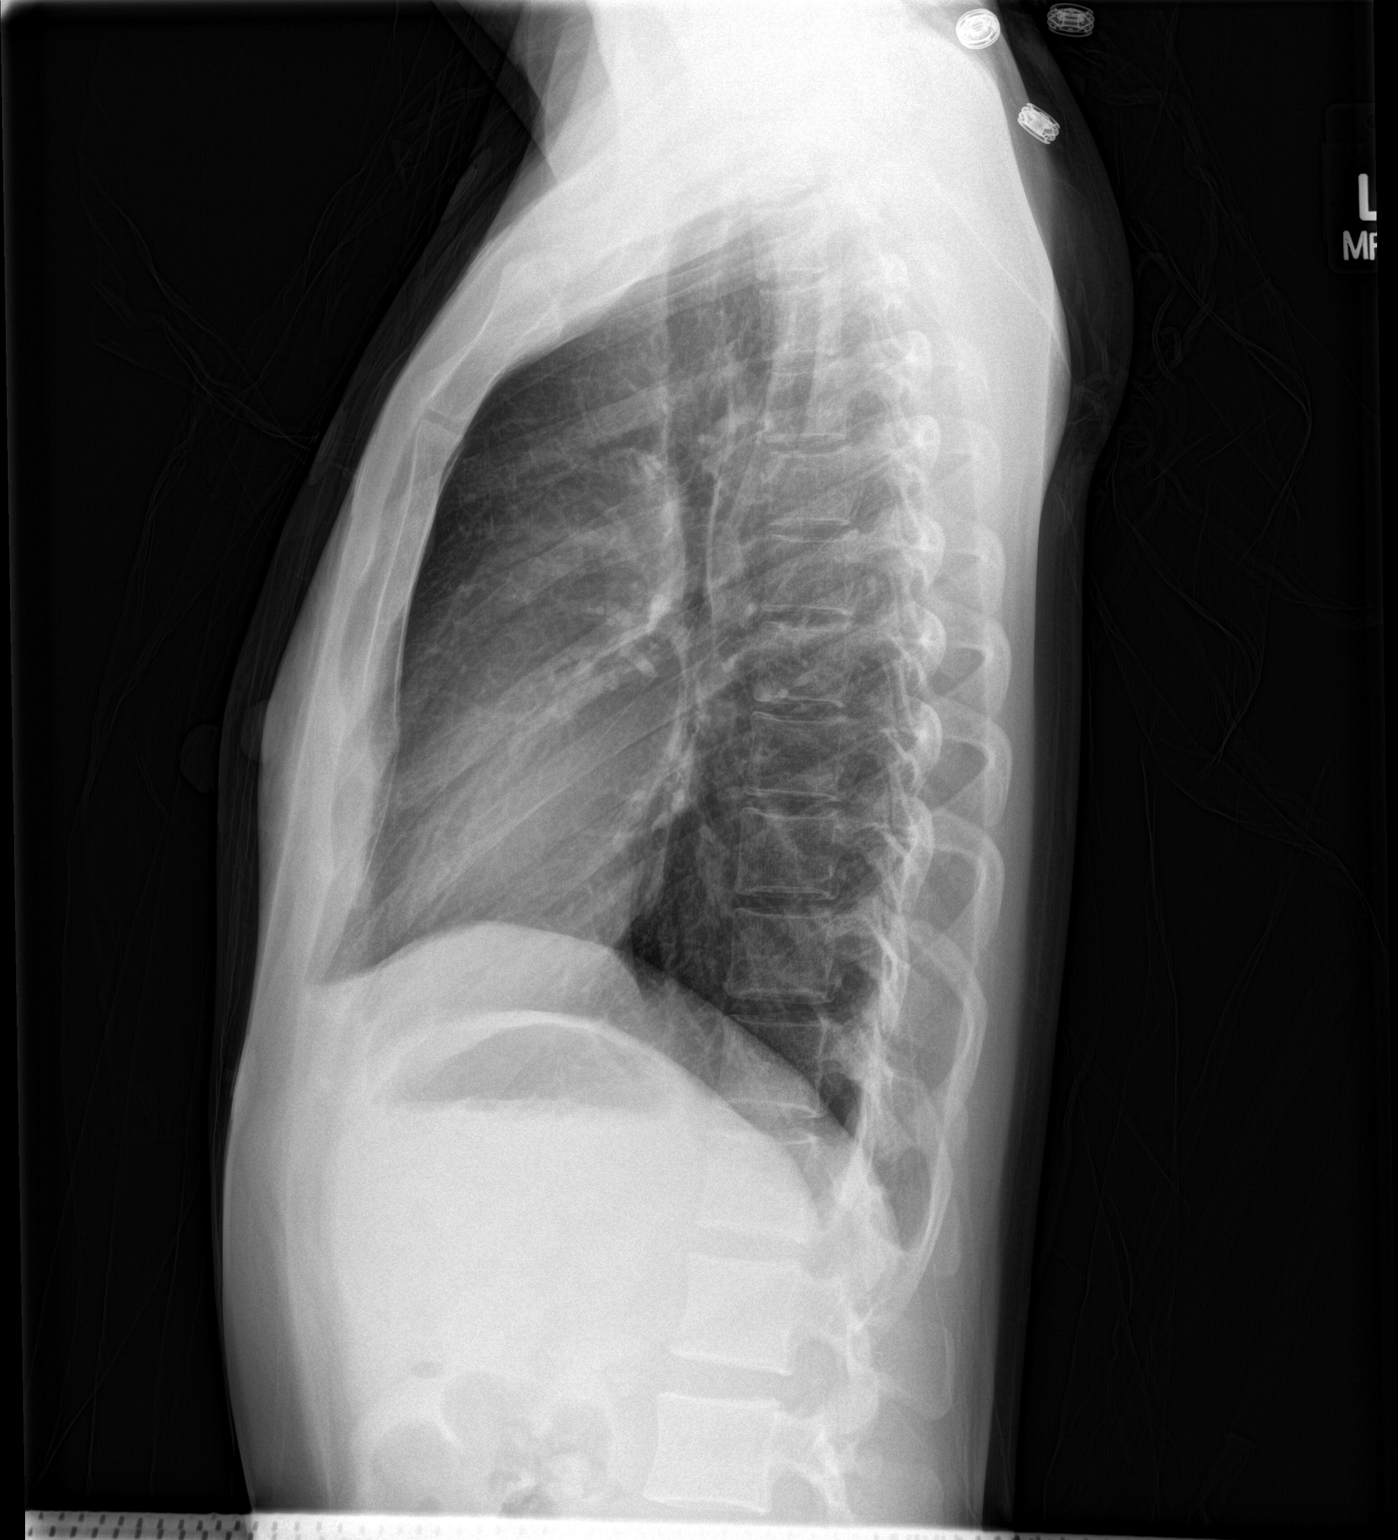

[2 of 2 positions shown; findings below may reference images not displayed]

FINDINGS: The heart size and mediastinal contours are within normal limits.
Both lungs are clear. The visualized skeletal structures are
unremarkable.
IMPRESSION: No active cardiopulmonary disease.

## 2017-06-19 IMAGING — CT CT ABD-PELV W/ CM
2 of 7 series · 15 of 46 positions shown, 17 images · IV contrast (APPLIED)
Comparison: March 17, 2014

CLINICAL DATA: Central abdominal pain

EXAM:
CT ABDOMEN AND PELVIS WITH CONTRAST
TECHNIQUE: Multidetector CT imaging of the abdomen and pelvis was performed
using the standard protocol following bolus administration of
intravenous contrast. Oral contrast was also administered.
CONTRAST:  75mL OMNIPAQUE IOHEXOL 300 MG/ML  SOLN

[Series 2: abd/ pelvis 5.0 i30f 1 · axial · 0.60mm/px · z∈[+607,+957]mm · 12 of 78 slices shown, 14 images]
[im 4/78  soft-tissue]
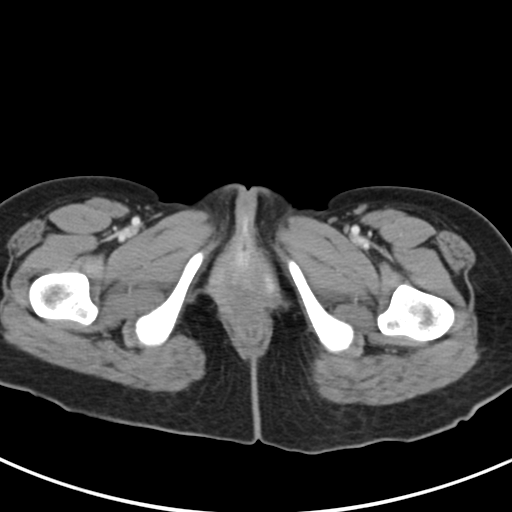
[im 4/78  bone]
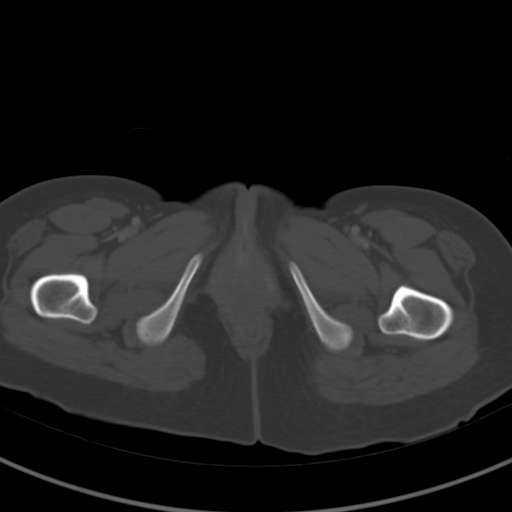
[im 11/78  soft-tissue]
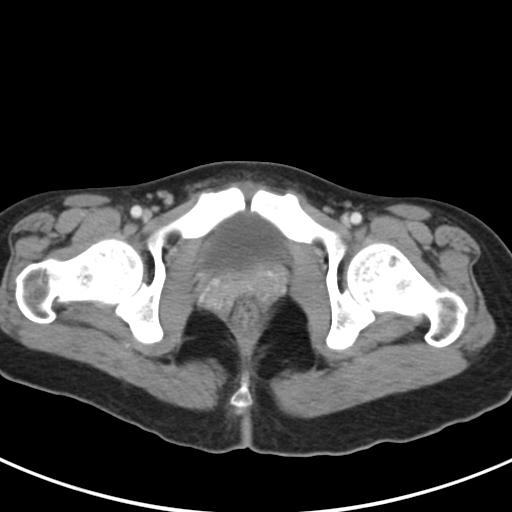
[im 17/78  soft-tissue]
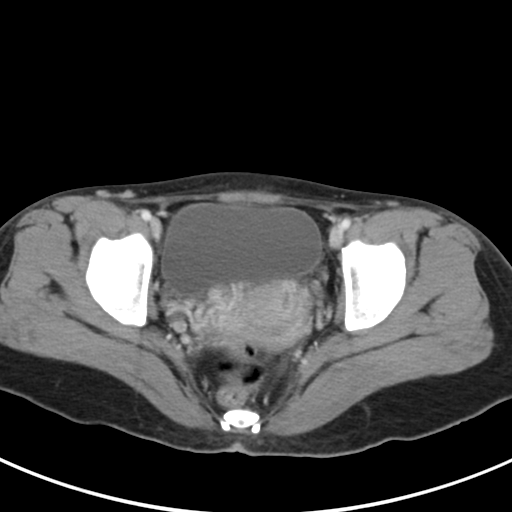
[im 24/78  soft-tissue]
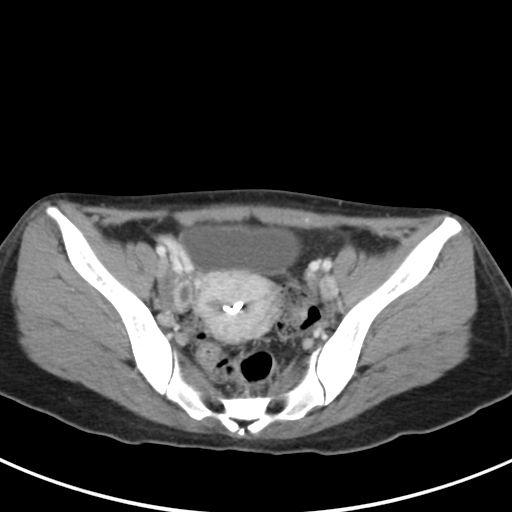
[im 31/78  soft-tissue]
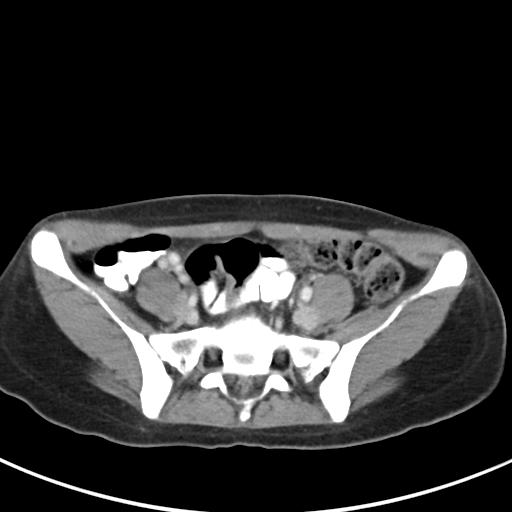
[im 37/78  soft-tissue]
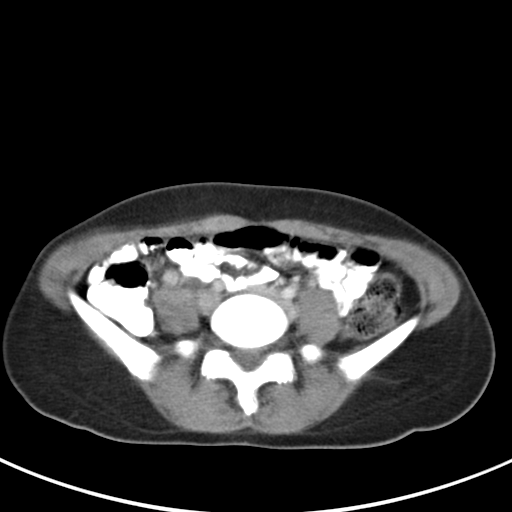
[im 41/78  soft-tissue]
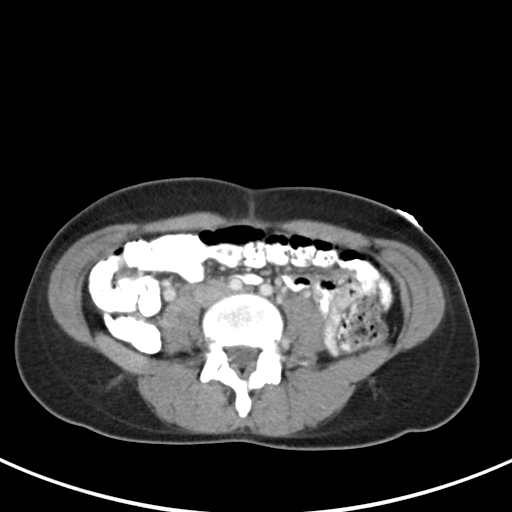
[im 47/78  soft-tissue]
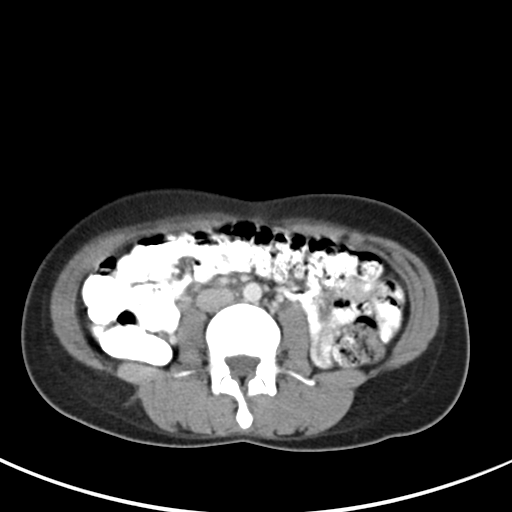
[im 54/78  soft-tissue]
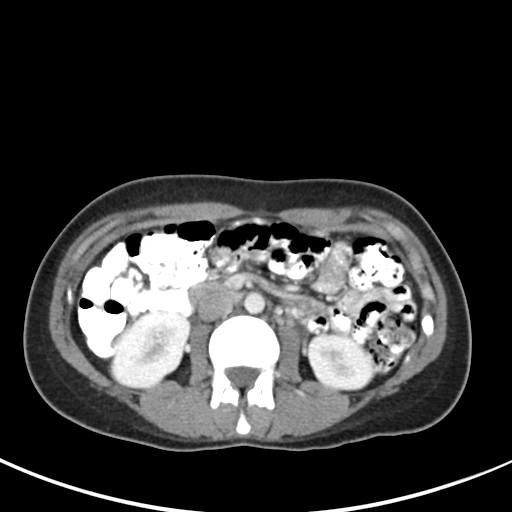
[im 54/78  bone]
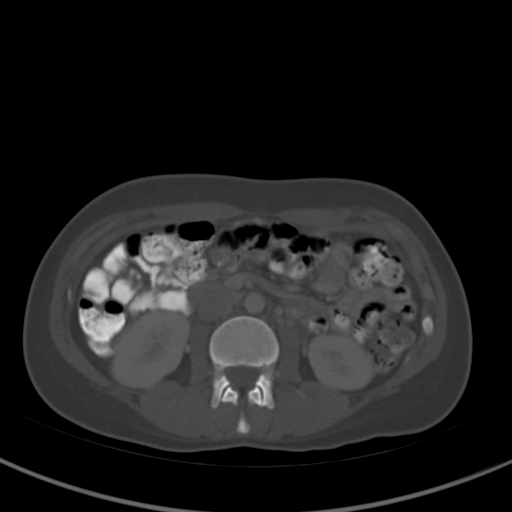
[im 61/78  soft-tissue]
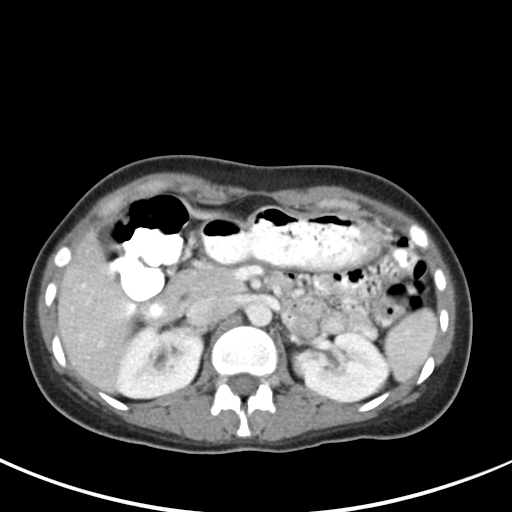
[im 67/78  soft-tissue]
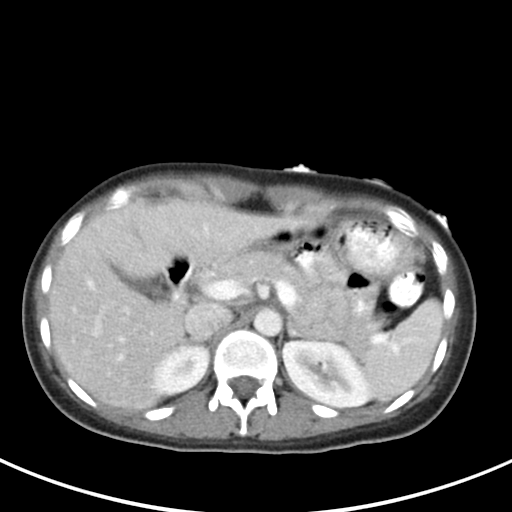
[im 74/78  soft-tissue]
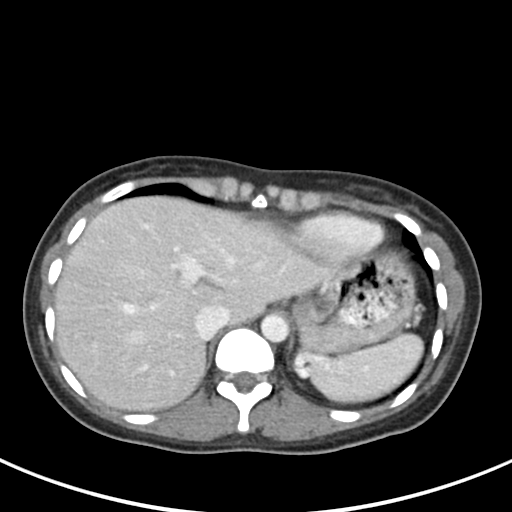

[Series 4: coronal soft tissue · coronal · 0.55mm/px · 3 of 51 slices shown]
[im 13/51  soft-tissue]
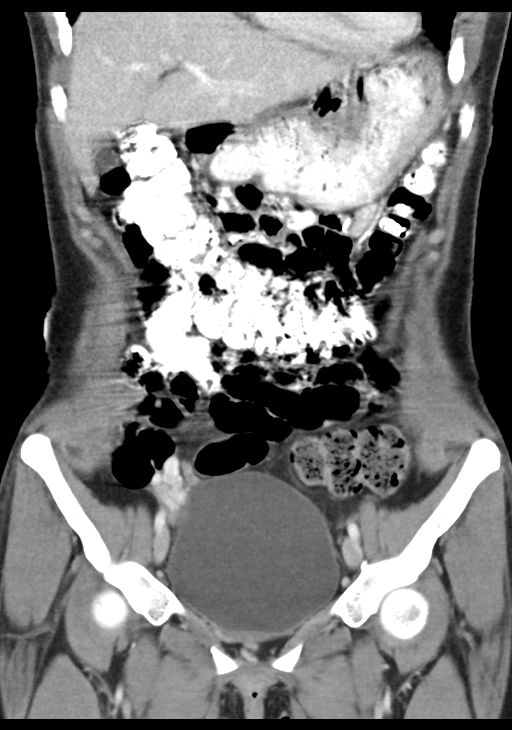
[im 26/51  soft-tissue]
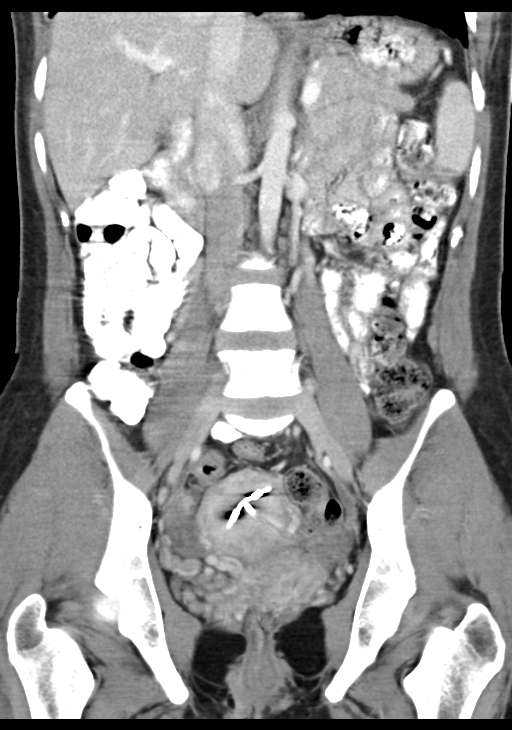
[im 38/51  soft-tissue]
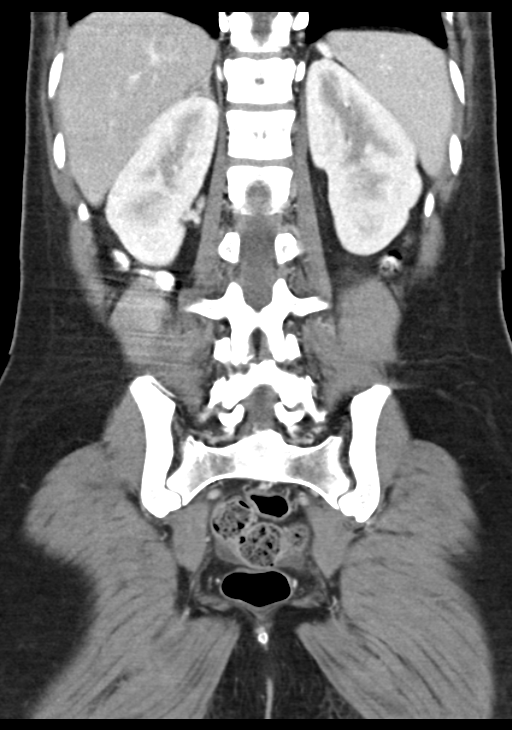

[15 of 46 positions shown; findings below may reference images not displayed]

FINDINGS: Lung bases are clear.

No focal liver lesions are identified. Gallbladder wall is not
appreciably thickened. There is no biliary duct dilatation.

Spleen, pancreas, and adrenals appear normal. Kidneys bilaterally
show no mass or hydronephrosis on either side. There is no renal or
ureteral calculus on either side.

In the pelvis, urinary bladder is midline with normal wall
thickness. There is an intrauterine device within the uterus. There
is a small amount of free fluid in the cul-de-sac. Fluid is seen
tracking from the right ovary to the cul-de-sac. There is a cystic
area in the right ovary with an enhancing rim measuring 2.0 x
cm. No other pelvic mass is appreciable. Appendix is not seen. There
is no periappendiceal region inflammation. There are multiple rather
prominent periuterine vascular structures, slightly more on the
right than on the left.

There is no bowel obstruction. No free air or portal venous air.
There is no appreciable adenopathy or abscess in the abdomen or
pelvis. There is no abdominal aortic aneurysm. There are no blastic
or lytic bone lesions.
IMPRESSION: Evidence suggesting recent ovarian cyst rupture on the right with
enhancement of the wall of the cystic structure in the right ovary
and fluid tracking from the right ovary into the cul-de-sac.

Prominent periuterine vascular structures are noted. In the
appropriate clinical setting, a degree of pelvic congestion syndrome
could present in this manner.

Intrauterine device in pelvis.

Appendix not seen.  No periappendiceal region appreciable.

No abscess. No bowel obstruction. No renal or ureteral calculus. No
hydronephrosis.
